# Patient Record
Sex: Female | Born: 1975 | Race: White | Hispanic: No | Marital: Married | Smoking: Never smoker
Health system: Southern US, Community
[De-identification: ages and names within clinical notes are randomized; demographics above are authoritative.]

## PROBLEM LIST (undated history)

## (undated) DIAGNOSIS — C801 Malignant (primary) neoplasm, unspecified: Secondary | ICD-10-CM

## (undated) DIAGNOSIS — E119 Type 2 diabetes mellitus without complications: Secondary | ICD-10-CM

## (undated) DIAGNOSIS — E063 Autoimmune thyroiditis: Secondary | ICD-10-CM

## (undated) HISTORY — PX: CHOLECYSTECTOMY: SHX55

## (undated) HISTORY — DX: Type 2 diabetes mellitus without complications: E11.9

## (undated) HISTORY — PX: ABDOMINAL HYSTERECTOMY: SHX81

---

## 2017-09-12 ENCOUNTER — Emergency Department (HOSPITAL_COMMUNITY)
Admission: EM | Admit: 2017-09-12 | Discharge: 2017-09-12 | Disposition: A | Discharge status: Home / Self Care | Payer: Self-pay | Attending: Emergency Medicine | Admitting: Emergency Medicine

## 2017-09-12 ENCOUNTER — Emergency Department (HOSPITAL_COMMUNITY): Payer: Self-pay

## 2017-09-12 ENCOUNTER — Encounter (HOSPITAL_COMMUNITY): Payer: Self-pay | Admitting: Emergency Medicine

## 2017-09-12 ENCOUNTER — Other Ambulatory Visit: Payer: Self-pay

## 2017-09-12 DIAGNOSIS — Z23 Encounter for immunization: Secondary | ICD-10-CM | POA: Insufficient documentation

## 2017-09-12 DIAGNOSIS — Y92511 Restaurant or cafe as the place of occurrence of the external cause: Secondary | ICD-10-CM | POA: Insufficient documentation

## 2017-09-12 DIAGNOSIS — X501XXA Overexertion from prolonged static or awkward postures, initial encounter: Secondary | ICD-10-CM | POA: Insufficient documentation

## 2017-09-12 DIAGNOSIS — W19XXXA Unspecified fall, initial encounter: Secondary | ICD-10-CM

## 2017-09-12 DIAGNOSIS — Y939 Activity, unspecified: Secondary | ICD-10-CM | POA: Insufficient documentation

## 2017-09-12 DIAGNOSIS — Y999 Unspecified external cause status: Secondary | ICD-10-CM | POA: Insufficient documentation

## 2017-09-12 DIAGNOSIS — M79672 Pain in left foot: Secondary | ICD-10-CM | POA: Insufficient documentation

## 2017-09-12 MED ORDER — TETANUS-DIPHTH-ACELL PERTUSSIS 5-2.5-18.5 LF-MCG/0.5 IM SUSP
0.5000 mL | Freq: Once | INTRAMUSCULAR | Status: AC
  Administered 2017-09-12: 0.5 mL via INTRAMUSCULAR
  Filled 2017-09-12: qty 0.5

## 2017-09-12 MED ORDER — FENTANYL CITRATE (PF) 100 MCG/2ML IJ SOLN
25.0000 ug | Freq: Once | INTRAMUSCULAR | Status: AC
Start: 2017-09-12 — End: 2017-09-12
  Administered 2017-09-12: 25 ug via INTRAMUSCULAR
  Filled 2017-09-12: qty 2

## 2017-09-12 MED ORDER — BACITRACIN ZINC 500 UNIT/GM EX OINT
TOPICAL_OINTMENT | Freq: Two times a day (BID) | CUTANEOUS | Status: DC
  Administered 2017-09-12: 1 via TOPICAL
  Filled 2017-09-12: qty 0.9

## 2017-09-12 MED ORDER — ACETAMINOPHEN 325 MG PO TABS
650.0000 mg | ORAL_TABLET | Freq: Once | ORAL | Status: AC
  Administered 2017-09-12: 650 mg via ORAL
  Filled 2017-09-12: qty 2

## 2017-09-12 NOTE — Discharge Instructions (Addendum)
You may alternate taking Tylenol and Ibuprofen as needed for pain control. You may take 400-600 mg of ibuprofen every 6 hours and [phone] mg of Tylenol every 6 hours. Do not exceed 4000 mg of Tylenol daily as this can lead to liver damage. Also, make sure to take Ibuprofen with meals as it can cause an upset stomach. Do not take other NSAIDs while taking Ibuprofen such as (Aleve, Naprosyn, Aspirin, Celebrex, etc) and do not take more than the prescribed dose as this can lead to ulcers and bleeding in your GI tract. You may use warm and cold compresses to help with your symptoms.   Please follow up with your primary doctor within the next 7-10 days for re-evaluation and further treatment of your symptoms.  You were given a referral to the orthopedic doctor.  If you continue to have pain you may call the office and make an appointment for follow-up.  Please return to the ER sooner if you have any new or worsening symptoms.

## 2017-09-12 NOTE — ED Triage Notes (Signed)
Pt reports she was coming out of restaurant and fell in mud. Pt c/o right foot pain, swelling noted to lateral side.  Pt has abrasion to left knee.

## 2017-09-12 NOTE — ED Notes (Signed)
Bed: WTR6 Expected date:  Expected time:  Means of arrival:  Comments: 

## 2017-09-12 NOTE — ED Provider Notes (Signed)
[facility] DEPT Provider Note   CSN: [id number] Arrival date & time: 09/12/17  1218     History   Chief Complaint Chief Complaint  Patient presents with  . Fall  . Foot Pain    HPI Joanne Rowland is a 42 y.o. female.  HPI   42 year old female who presents the emergency department today to be evaluated for right foot injury that began today after a fall.  States she was walking out of a restaurant and slipped and fell after walking through some mild.  Twisted her right foot and ankle and is now having right lateral foot and ankle pain.  Pain sudden in onset, severe in nature.  Worse with movement and walking.  Has not tried any interventions for symptoms.  Also has abrasion to left knee and left foot but no knee pain or foot pain on the left side.  No head trauma or LOC.  No neck or back pain no chest pain shortness of breath, abdominal pain or any other injuries from the fall.  Patient states her tetanus shot is not up-to-date.  No associated numbness or weakness to the right foot.  History reviewed. No pertinent past medical history.  There are no active problems to display for this patient.   History reviewed. No pertinent surgical history.   OB History   None      Home Medications    Prior to Admission medications   Not on File    Family History No family history on file.  Social History Social History   Tobacco Use  . Smoking status: Never Smoker  . Smokeless tobacco: Never Used  Substance Use Topics  . Alcohol use: Yes    Comment: occasional   . Drug use: Not on file     Allergies   Patient has no allergy information on record.   Review of Systems Review of Systems  Musculoskeletal:       Right foot and ankle pain  Skin: Positive for wound.  Neurological: Negative for weakness and numbness.     Physical Exam Updated Vital Signs BP (!) 130/95 (BP Location: Left Arm)   Pulse 94   Temp 98.1 F (36.7 C) (Oral)    Resp 16   Ht 5\' 7"  (1.702 m)   Wt 104.3 kg (230 lb)   SpO2 96%   BMI 36.02 kg/m   Physical Exam  Constitutional: She appears well-developed and well-nourished. No distress.  HENT:  Head: Normocephalic and atraumatic.  Eyes: Conjunctivae are normal.  Neck: Neck supple.  Cardiovascular: Normal rate.  Pulmonary/Chest: Effort normal.  Musculoskeletal: Normal range of motion.  Tenderness to the lateral aspect of the right hindfoot and midfoot with overlying swelling.  No erythema.  No significant tenderness to the forefoot or medial aspect of the foot.  No tenderness to the medial malleolus.  Mild tenderness to the inferior portion of the lateral malleolus.  Achilles tendon intact.  Dorsiflexion/plantar flexion intact bilaterally able to flex/extend knees bilaterally.  No tenderness throughout the bilateral knees.  Abrasion noted to the left knee with no active bleeding or foreign bodies.  Mild abrasion to the dorsal aspect of the left foot.  Distal pulses and sensation intact bilaterally.  Brisk cap refill bilaterally.  Able to wiggle toes bilaterally.  Neurological: She is alert.  Skin: Skin is warm and dry.  Psychiatric: She has a normal mood and affect.  Nursing note and vitals reviewed.   ED Treatments / Results  Labs (  all labs ordered are listed, but only abnormal results are displayed) Labs Reviewed - No data to display  EKG None  Radiology Dg Ankle Complete Right  Result Date: 09/12/2017 CLINICAL DATA:  Pain following fall EXAM: RIGHT ANKLE - COMPLETE 3+ VIEW COMPARISON:  None. FINDINGS: Frontal, oblique, and lateral views were obtained. There is no evident fracture or joint effusion. The joint spaces appear normal. No erosive change. Ankle mortise appears intact. There is a minimal inferior calcaneal spur. IMPRESSION: Minimal inferior calcaneal spur. No evident fracture or arthropathic change. Ankle mortise appears intact. Electronically Signed   By: Lowella Grip III M.D.    On: 09/12/2017 13:33   Dg Foot Complete Right  Result Date: 09/12/2017 CLINICAL DATA:  Pain following fall EXAM: RIGHT FOOT COMPLETE - 3+ VIEW COMPARISON:  None. FINDINGS: Frontal, oblique, and lateral views were obtained. There is no fracture or dislocation. Joint spaces appear normal. No erosive change. IMPRESSION: No fracture or dislocation.  No evident arthropathy. Electronically Signed   By: Lowella Grip III M.D.   On: 09/12/2017 13:32    Procedures Procedures (including critical care time)  Medications Ordered in ED Medications  bacitracin ointment (1 application Topical Given 09/12/17 1409)  Tdap (BOOSTRIX) injection 0.5 mL (0.5 mLs Intramuscular Given 09/12/17 1312)  fentaNYL (SUBLIMAZE) injection 25 mcg (25 mcg Intramuscular Given 09/12/17 1314)  acetaminophen (TYLENOL) tablet 650 mg (650 mg Oral Given 09/12/17 1311)     Initial Impression / Assessment and Plan / ED Course  I have reviewed the triage vital signs and the nursing notes.  Pertinent labs & imaging results that were available during my care of the patient were reviewed by me and considered in my medical decision making (see chart for details).   Final Clinical Impressions(s) / ED Diagnoses   Final diagnoses:  Fall, initial encounter  Foot pain, left   Patient presenting with right foot and ankle pain after fall prior to arrival.  Mildly hypertensive and borderline tachycardic on arrival.  Otherwise vital signs normal.  All vital signs normalized by the time of discharge.  She was given fentanyl and Tylenol in the ED with improvement of her pain.  Tdap was updated and wound care completed on abrasions.  X-ray of the right foot and ankle reviewed and were negative for acute fracture or abnormality.  Patient given postop shoe and crutches to use.  Advised Tylenol, Motrin and rice protocol for symptoms.  She was also given an orthopedic referral if symptoms continue.  Advised her to follow-up with primary doctor in 1 week  for reevaluation and to return if worse.  All questions were answered and patient understands plan reasons to return immediately to the ED.Marland Kitchen   ED Discharge Orders    None       Bishop Dublin 09/12/17 1448    Quintella Reichert, MD 09/13/17 [phone]

## 2017-09-22 ENCOUNTER — Ambulatory Visit (INDEPENDENT_AMBULATORY_CARE_PROVIDER_SITE_OTHER): Payer: Self-pay | Admitting: Orthopaedic Surgery

## 2017-09-23 ENCOUNTER — Encounter (INDEPENDENT_AMBULATORY_CARE_PROVIDER_SITE_OTHER): Payer: Self-pay | Admitting: Orthopaedic Surgery

## 2017-09-23 ENCOUNTER — Ambulatory Visit (INDEPENDENT_AMBULATORY_CARE_PROVIDER_SITE_OTHER): Payer: Self-pay | Admitting: Orthopaedic Surgery

## 2017-09-23 DIAGNOSIS — M25571 Pain in right ankle and joints of right foot: Secondary | ICD-10-CM

## 2017-09-23 NOTE — Progress Notes (Signed)
   Office Visit Note   Patient: Joanne Rowland           Date of Birth: Dec 16, 1975           MRN: [id number] Visit Date: 09/23/2017              Requested by: No referring provider defined for this encounter. PCP: Patient, No Pcp Per   Assessment & Plan: Visit Diagnoses:  1. Pain in right ankle and joints of right foot     Plan: Impression is 42 year old female with right grade 3 ankle sprain.  Recommend ASO brace for the next couple weeks and then wean as tolerated.  Recommend continue symptomatic treatment.  Questions encouraged and answered.  Follow-up as needed.  Follow-Up Instructions: Return if symptoms worsen or fail to improve.   Orders:  No orders of the defined types were placed in this encounter.  No orders of the defined types were placed in this encounter.     Procedures: No procedures performed   Clinical Data: No additional findings.   Subjective: Chief Complaint  Patient presents with  . Right Foot - Pain    Patient is a very pleasant 42 year old female comes in with acute right ankle injury.  She sustained a sprain about 10 days ago when she had a mechanical fall.  She presented to the ED and x-rays were negative.  She continues to have pain and wants further evaluation and treatment.  Denies any numbness and tingling.   Review of Systems  Constitutional: Negative.   HENT: Negative.   Eyes: Negative.   Respiratory: Negative.   Cardiovascular: Negative.   Endocrine: Negative.   Musculoskeletal: Negative.   Neurological: Negative.   Hematological: Negative.   Psychiatric/Behavioral: Negative.   All other systems reviewed and are negative.    Objective: Vital Signs: There were no vitals taken for this visit.  Physical Exam  Constitutional: She is oriented to person, place, and time. She appears well-developed and well-nourished.  HENT:  Head: Normocephalic and atraumatic.  Eyes: EOM are normal.  Neck: Neck supple.  Pulmonary/Chest:  Effort normal.  Abdominal: Soft.  Neurological: She is alert and oriented to person, place, and time.  Skin: Skin is warm. Capillary refill takes less than 2 seconds.  Psychiatric: She has a normal mood and affect. Her behavior is normal. Judgment and thought content normal.  Nursing note and vitals reviewed.   Ortho Exam Right ankle exam shows moderate ecchymosis and mild swelling.  Ankle joint is stable.  Subtalar joint is stable.  Neurovascular intact. Specialty Comments:  No specialty comments available.  Imaging: No results found.   PMFS History: There are no active problems to display for this patient.  History reviewed. No pertinent past medical history.  History reviewed. No pertinent family history.  History reviewed. No pertinent surgical history. Social History   Occupational History  . Not on file  Tobacco Use  . Smoking status: Never Smoker  . Smokeless tobacco: Never Used  Substance and Sexual Activity  . Alcohol use: Yes    Comment: occasional   . Drug use: Not on file  . Sexual activity: Not on file

## 2019-04-11 ENCOUNTER — Emergency Department
Admission: EM | Admit: 2019-04-11 | Discharge: 2019-04-11 | Disposition: A | Discharge status: Home / Self Care | Payer: BC Managed Care – PPO | Attending: Emergency Medicine | Admitting: Emergency Medicine

## 2019-04-11 ENCOUNTER — Other Ambulatory Visit: Payer: Self-pay

## 2019-04-11 ENCOUNTER — Emergency Department: Payer: BC Managed Care – PPO

## 2019-04-11 ENCOUNTER — Encounter: Payer: Self-pay | Admitting: Emergency Medicine

## 2019-04-11 DIAGNOSIS — Z888 Allergy status to other drugs, medicaments and biological substances status: Secondary | ICD-10-CM | POA: Diagnosis not present

## 2019-04-11 DIAGNOSIS — M5442 Lumbago with sciatica, left side: Secondary | ICD-10-CM | POA: Insufficient documentation

## 2019-04-11 DIAGNOSIS — M5441 Lumbago with sciatica, right side: Secondary | ICD-10-CM | POA: Diagnosis not present

## 2019-04-11 DIAGNOSIS — M5440 Lumbago with sciatica, unspecified side: Secondary | ICD-10-CM

## 2019-04-11 DIAGNOSIS — M545 Low back pain: Secondary | ICD-10-CM | POA: Diagnosis present

## 2019-04-11 MED ORDER — KETOROLAC TROMETHAMINE 30 MG/ML IJ SOLN
30.0000 mg | Freq: Once | INTRAMUSCULAR | Status: AC
  Administered 2019-04-11: 30 mg via INTRAMUSCULAR
  Filled 2019-04-11: qty 1

## 2019-04-11 MED ORDER — METHOCARBAMOL 500 MG PO TABS: ORAL_TABLET | ORAL | 0 refills | Status: DC

## 2019-04-11 MED ORDER — OXYCODONE-ACETAMINOPHEN 5-325 MG PO TABS
1.0000 | ORAL_TABLET | Freq: Once | ORAL | Status: AC
  Administered 2019-04-11: 1 via ORAL
  Filled 2019-04-11: qty 1

## 2019-04-11 MED ORDER — HYDROCODONE-ACETAMINOPHEN 5-325 MG PO TABS: 1.0000 | ORAL_TABLET | Freq: Four times a day (QID) | ORAL | 0 refills | Status: DC | PRN

## 2019-04-11 MED ORDER — PREDNISONE 10 MG PO TABS: ORAL_TABLET | ORAL | 0 refills | Status: DC

## 2019-04-11 NOTE — ED Provider Notes (Signed)
[facility] Emergency Department Provider Note  ____________________________________________   First MD Initiated Contact with Patient 04/11/19 1009     (approximate)  I have reviewed the triage vital signs and the nursing notes.   HISTORY  Chief Complaint Back Pain   HPI Joanne Rowland is a 44 y.o. female presents to the ED with complaint of low back pain that started approximately 4 weeks ago.  Patient is unsure of any injury.  She states that she walks 5K's and thought that the pain was coming because of her feet and made an appointment with podiatry.  She states that today the pain is increased and she cannot wait for her appointment.  She has been taking over-the-counter medication without any relief.  She rates her pain as 9/10.  She denies any urinary symptoms or incontinence of bowel or bladder.  There is been no previous back injury.       History reviewed. No pertinent past medical history.  There are no problems to display for this patient.   History reviewed. No pertinent surgical history.  Prior to Admission medications   Medication Sig Start Date End Date Taking? Authorizing Provider  HYDROcodone-acetaminophen (NORCO/VICODIN) 5-325 MG tablet Take 1 tablet by mouth every 6 (six) hours as needed for moderate pain. 04/11/19   Johnn Hai, PA-C  methocarbamol (ROBAXIN) 500 MG tablet 1 or 2 every 6 hours as needed for muscle spasms or take it bedtime only if needed 04/11/19   Johnn Hai, PA-C  predniSONE (DELTASONE) 10 MG tablet Take 6 tablets  today, on day 2 take 5 tablets, day 3 take 4 tablets, day 4 take 3 tablets, day 5 take  2 tablets and 1 tablet the last day 04/11/19   Johnn Hai, PA-C    Allergies Sumatriptan  No family history on file.  Social History Social History   Tobacco Use  . Smoking status: Never Smoker  . Smokeless tobacco: Never Used  Substance Use Topics  . Alcohol use: Yes    Comment: occasional   .  Drug use: Not on file    Review of Systems Constitutional: No fever/chills Cardiovascular: Denies chest pain. Respiratory: Denies shortness of breath. Gastrointestinal: No abdominal pain.  No nausea, no vomiting.   Genitourinary: Negative for dysuria. Musculoskeletal: Positive for low back pain and bilateral feet pain. Skin: Negative for rash. Neurological: Negative for headaches, focal weakness or numbness. ____________________________________________   PHYSICAL EXAM:  VITAL SIGNS: ED Triage Vitals  Enc Vitals Group     BP 04/11/19 0918 (!) 150/96     Pulse Rate 04/11/19 0918 78     Resp 04/11/19 0918 18     Temp 04/11/19 0918 98.2 F (36.8 C)     Temp Source 04/11/19 0918 Oral     SpO2 04/11/19 0918 97 %     Weight 04/11/19 0918 210 lb (95.3 kg)     Height 04/11/19 0918 5\' 7"  (1.702 m)     Head Circumference --      Peak Flow --      Pain Score 04/11/19 0922 9     Pain Loc --      Pain Edu? --      Excl. in [place]? --     Constitutional: Alert and oriented. Well appearing and in no acute distress. Eyes: Conjunctivae are normal.  Head: Atraumatic. Neck: No stridor.   Cardiovascular: Normal rate, regular rhythm. Grossly normal heart sounds.  Good peripheral circulation. Respiratory: Normal  respiratory effort.  No retractions. Lungs CTAB. Gastrointestinal: Soft and nontender. No distention. Musculoskeletal: On examination of the lumbar spine there is moderate tenderness on palpation bilateral sacral area and SI joints.  Right is slightly more tender than the left.  Range of motion is restricted.  Good muscle strength bilaterally.   Patient is able to ambulate without any assistance. Neurologic:  Normal speech and language.  Reflexes are 2+ bilaterally.  No gross focal neurologic deficits are appreciated.  Skin:  Skin is warm, dry and intact. Psychiatric: Mood and affect are normal. Speech and behavior are normal.  ____________________________________________   LABS (all  labs ordered are listed, but only abnormal results are displayed)  Labs Reviewed - No data to display RADIOLOGY   Official radiology report(s): DG Lumbar Spine 2-3 Views  Result Date: 04/11/2019 CLINICAL DATA:  Worsening low back pain over the past 4 weeks. No known injury. EXAM: LUMBAR SPINE - 2-3 VIEW COMPARISON:  None. FINDINGS: There is no evidence of lumbar spine fracture. Alignment is normal. Intervertebral disc spaces are maintained. IMPRESSION: Normal exam. Electronically Signed   By: Inge Rise M.D.   On: 04/11/2019 12:17    ____________________________________________   PROCEDURES  Procedure(s) performed (including Critical Care):  Procedures   ____________________________________________   INITIAL IMPRESSION / ASSESSMENT AND PLAN / ED COURSE  As part of my medical decision making, I reviewed the following data within the electronic MEDICAL RECORD NUMBER Notes from prior ED visits and [place] Controlled Substance Database  44 year old female presents to the ED with complaint of low back pain which travels down her legs bilaterally causing her feet to hurt.  Patient initially thought that it was her feet causing her back to hurt as she walks in multiple 5K's.  Patient was given Toradol 30 mg IM and Percocet while in the ED.  X-rays of her lumbar spine were negative.  Patient was made aware that most likely her radicular pain is coming from her back rather than from her feet.  She was given a prescription for Norco, Robaxin, and prednisone taper.  Patient is to follow-up with her PCP if any continued problems or return to the emergency department if any severe worsening of her symptoms especially incontinence of bowel or bladder.  ____________________________________________   FINAL CLINICAL IMPRESSION(S) / ED DIAGNOSES  Final diagnoses:  Bilateral low back pain with sciatica, sciatica laterality unspecified, unspecified chronicity     ED Discharge Orders         Ordered      HYDROcodone-acetaminophen (NORCO/VICODIN) 5-325 MG tablet  Every 6 hours PRN     04/11/19 1242    methocarbamol (ROBAXIN) 500 MG tablet     04/11/19 1242    predniSONE (DELTASONE) 10 MG tablet     04/11/19 1242           Note:  This document was prepared using Dragon voice recognition software and may include unintentional dictation errors.    Johnn Hai, PA-C 04/11/19 1644    Arta Silence, MD 04/17/19 [phone]

## 2019-04-11 NOTE — ED Triage Notes (Signed)
Pt c/o lower back pain for the past 4 weeks that has gotten worse. Pt states walks 5k's and thought it may be related to that so made an appt with podiatry on Wednesday but cannot wait until then.

## 2019-04-11 NOTE — Discharge Instructions (Addendum)
Follow-up with your primary care provider or [facility] acute care if any continued problems.  Return to the emergency department if any severe worsening of your symptoms especially if there is any incontinence of bowel or bladder.  Begin taking Norco 1 every 6 hours as needed for pain and methocarbamol can be taken 1 or 2 every 6 hours as needed for muscle spasms.  This is the medication that could be taken only at bedtime if needed.  This medication along with the pain medication could cause drowsiness and increase your risk for falling.  Prednisone is to be started today starting with 6 tablets and tapering down.  You may also use ice or heat to your back as needed for discomfort.

## 2019-04-11 NOTE — ED Notes (Signed)
See triage note  Presents with lower back pain  Stats pain started about 4 weeks ago  Unsure of injury  Ambulates slowly d/t pain

## 2019-04-13 ENCOUNTER — Other Ambulatory Visit: Payer: Self-pay

## 2019-04-13 ENCOUNTER — Ambulatory Visit (INDEPENDENT_AMBULATORY_CARE_PROVIDER_SITE_OTHER): Payer: BC Managed Care – PPO | Admitting: Podiatry

## 2019-04-13 ENCOUNTER — Other Ambulatory Visit: Payer: Self-pay | Admitting: Podiatry

## 2019-04-13 ENCOUNTER — Encounter: Payer: Self-pay | Admitting: Podiatry

## 2019-04-13 ENCOUNTER — Ambulatory Visit: Payer: Self-pay | Admitting: Podiatry

## 2019-04-13 ENCOUNTER — Ambulatory Visit (INDEPENDENT_AMBULATORY_CARE_PROVIDER_SITE_OTHER): Payer: BC Managed Care – PPO

## 2019-04-13 DIAGNOSIS — M2041 Other hammer toe(s) (acquired), right foot: Secondary | ICD-10-CM

## 2019-04-13 DIAGNOSIS — Q828 Other specified congenital malformations of skin: Secondary | ICD-10-CM | POA: Diagnosis not present

## 2019-04-13 DIAGNOSIS — M7750 Other enthesopathy of unspecified foot: Secondary | ICD-10-CM

## 2019-04-13 NOTE — Progress Notes (Signed)
  Subjective:  Patient ID: Joanne Rowland, female    DOB: January 12, 1976,  MRN: [id number] HPI Chief Complaint  Patient presents with  . Callouses    Patient presents today for painful corn to right 4th toe x 2 months.  She states "its a hot constant pain that radiates across the toe and into the top of my foot"  She has used cornpads which has not helped    44 y.o. female presents with the above complaint.   ROS: Denies fever chills nausea vomiting muscle aches pains calf pain back pain chest pain shortness of breath.  No past medical history on file. No past surgical history on file.  Current Outpatient Medications:  .  HYDROcodone-acetaminophen (NORCO/VICODIN) 5-325 MG tablet, Take 1 tablet by mouth every 6 (six) hours as needed for moderate pain., Disp: 15 tablet, Rfl: 0 .  Ibuprofen 200 MG CAPS, , Disp: , Rfl:  .  methocarbamol (ROBAXIN) 500 MG tablet, 1 or 2 every 6 hours as needed for muscle spasms or take it bedtime only if needed, Disp: 20 tablet, Rfl: 0 .  predniSONE (DELTASONE) 10 MG tablet, Take 6 tablets  today, on day 2 take 5 tablets, day 3 take 4 tablets, day 4 take 3 tablets, day 5 take  2 tablets and 1 tablet the last day, Disp: 21 tablet, Rfl: 0  Allergies  Allergen Reactions  . Sumatriptan Anaphylaxis   Review of Systems Objective:  There were no vitals filed for this visit.  General: Well developed, nourished, in no acute distress, alert and oriented x3   Dermatological: Skin is warm, dry and supple bilateral. Nails x 10 are well maintained; remaining integument appears unremarkable at this time. There are no open sores, no preulcerative lesions, no rash or signs of infection present.  Vascular: Dorsalis Pedis artery and Posterior Tibial artery pedal pulses are 2/4 bilateral with immedate capillary fill time. Pedal hair growth present. No varicosities and no lower extremity edema present bilateral.   Neruologic: Grossly intact via light touch bilateral. Vibratory  intact via tuning fork bilateral. Protective threshold with Semmes Wienstein monofilament intact to all pedal sites bilateral. Patellar and Achilles deep tendon reflexes 2+ bilateral. No Babinski or clonus noted bilateral.   Musculoskeletal: No gross boney pedal deformities bilateral. No pain, crepitus, or limitation noted with foot and ankle range of motion bilateral. Muscular strength 5/5 in all groups tested bilateral.  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  This is most likely associated with the fifth toe rubbing the fourth toe.   Gait: Unassisted, Nonantalgic.    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  Plan: Dexamethasone was injected today the lateral aspect of the PIPJ fourth digit right foot.  I debrided the overlying reactive hyperkeratotic lesion and will follow-up with her on an as-needed basis.  We did briefly discuss the possible need for surgery.  Also provided her with a silicone sleeves.     Maryam Feely T. [place], [place]

## 2019-08-23 ENCOUNTER — Ambulatory Visit (INDEPENDENT_AMBULATORY_CARE_PROVIDER_SITE_OTHER): Payer: BC Managed Care – PPO

## 2019-08-23 ENCOUNTER — Ambulatory Visit (INDEPENDENT_AMBULATORY_CARE_PROVIDER_SITE_OTHER): Payer: BC Managed Care – PPO | Admitting: Podiatry

## 2019-08-23 ENCOUNTER — Other Ambulatory Visit: Payer: Self-pay | Admitting: Podiatry

## 2019-08-23 ENCOUNTER — Encounter: Payer: Self-pay | Admitting: Podiatry

## 2019-08-23 ENCOUNTER — Other Ambulatory Visit: Payer: Self-pay

## 2019-08-23 DIAGNOSIS — S99922A Unspecified injury of left foot, initial encounter: Secondary | ICD-10-CM | POA: Diagnosis not present

## 2019-08-23 DIAGNOSIS — S93602A Unspecified sprain of left foot, initial encounter: Secondary | ICD-10-CM

## 2019-08-23 MED ORDER — MELOXICAM 15 MG PO TABS: 15.0000 mg | ORAL_TABLET | Freq: Every day | ORAL | 1 refills | Status: DC

## 2019-08-25 NOTE — Progress Notes (Signed)
   HPI: 44 y.o. female presenting today with a chief complaint of throbbing, pulling pain to the left ankle that began 5 days ago secondary to an injury on 08/18/2019. She states she initially twisted the ankle and foot but ran a 5k the next day. She now reports severe pain and swelling. Walking and bearing weight increases the pain. She has been icing and elevating the foot, taking Ibuprofen and Tylenol for treatment. Patient is here for further evaluation and treatment.  No past medical history on file.   Physical Exam: General: The patient is alert and oriented x3 in no acute distress.  Dermatology: Skin is warm, dry and supple bilateral lower extremities. Negative for open lesions or macerations.  Vascular: Palpable pedal pulses bilaterally. No erythema noted. Capillary refill within normal limits.  Neurological: Epicritic and protective threshold grossly intact bilaterally.   Musculoskeletal Exam: Pain with palpation noted to the left lateral foot with mild edema noted. Range of motion within normal limits to all pedal and ankle joints bilateral. Muscle strength 5/5 in all groups bilateral.   Radiographic Exam:  Normal osseous mineralization. Joint spaces preserved. No fracture/dislocation/boney destruction.    Assessment: 1. Left lateral foot sprain    Plan of Care:  1. Patient evaluated. X-Rays reviewed.  2. CAM boot dispensed. Weightbearing for two weeks.  3. Prescription for Meloxicam provided to patient. 4. Return to clinic as needed.       Edrick Kins, DPM [facility]  Dr. Edrick Kins, DPM    [address], [place] [address]                Office [phone]  Fax [phone]

## 2019-09-01 ENCOUNTER — Other Ambulatory Visit: Payer: Self-pay

## 2019-09-01 ENCOUNTER — Emergency Department (HOSPITAL_COMMUNITY): Payer: BC Managed Care – PPO

## 2019-09-01 ENCOUNTER — Encounter (HOSPITAL_COMMUNITY): Payer: Self-pay

## 2019-09-01 ENCOUNTER — Emergency Department (HOSPITAL_COMMUNITY)
Admission: EM | Admit: 2019-09-01 | Discharge: 2019-09-01 | Disposition: A | Discharge status: Home / Self Care | Payer: BC Managed Care – PPO | Attending: Emergency Medicine | Admitting: Emergency Medicine

## 2019-09-01 DIAGNOSIS — K529 Noninfective gastroenteritis and colitis, unspecified: Secondary | ICD-10-CM

## 2019-09-01 DIAGNOSIS — Z8542 Personal history of malignant neoplasm of other parts of uterus: Secondary | ICD-10-CM | POA: Insufficient documentation

## 2019-09-01 DIAGNOSIS — R1013 Epigastric pain: Secondary | ICD-10-CM | POA: Insufficient documentation

## 2019-09-01 DIAGNOSIS — R197 Diarrhea, unspecified: Secondary | ICD-10-CM | POA: Insufficient documentation

## 2019-09-01 DIAGNOSIS — Z79899 Other long term (current) drug therapy: Secondary | ICD-10-CM | POA: Diagnosis not present

## 2019-09-01 HISTORY — DX: Malignant (primary) neoplasm, unspecified: C80.1

## 2019-09-01 LAB — CBC
HCT: 46 % (ref 36.0–46.0)
Hemoglobin: 15.3 g/dL — ABNORMAL HIGH (ref 12.0–15.0)
MCH: 30.3 pg (ref 26.0–34.0)
MCHC: 33.3 g/dL (ref 30.0–36.0)
MCV: 91.1 fL (ref 80.0–100.0)
Platelets: 272 10*3/uL (ref 150–400)
RBC: 5.05 MIL/uL (ref 3.87–5.11)
RDW: 13.7 % (ref 11.5–15.5)
WBC: 8.6 10*3/uL (ref 4.0–10.5)
nRBC: 0 % (ref 0.0–0.2)

## 2019-09-01 LAB — COMPREHENSIVE METABOLIC PANEL
ALT: 49 U/L — ABNORMAL HIGH (ref 0–44)
AST: 59 U/L — ABNORMAL HIGH (ref 15–41)
Albumin: 3.9 g/dL (ref 3.5–5.0)
Alkaline Phosphatase: 83 U/L (ref 38–126)
Anion gap: 13 (ref 5–15)
BUN: 13 mg/dL (ref 6–20)
CO2: 23 mmol/L (ref 22–32)
Calcium: 8.7 mg/dL — ABNORMAL LOW (ref 8.9–10.3)
Chloride: 101 mmol/L (ref 98–111)
Creatinine, Ser: 0.85 mg/dL (ref 0.44–1.00)
GFR calc Af Amer: >60 mL/min (ref >60)
GFR calc non Af Amer: >60 mL/min (ref >60)
Glucose, Bld: 127 mg/dL — ABNORMAL HIGH (ref 70–99)
Potassium: 4.2 mmol/L (ref 3.5–5.1)
Sodium: 137 mmol/L (ref 135–145)
Total Bilirubin: 1 mg/dL (ref 0.3–1.2)
Total Protein: 7.6 g/dL (ref 6.5–8.1)

## 2019-09-01 LAB — URINALYSIS, ROUTINE W REFLEX MICROSCOPIC
Bilirubin Urine: NEGATIVE
Glucose, UA: NEGATIVE mg/dL
Hgb urine dipstick: NEGATIVE
Ketones, ur: NEGATIVE mg/dL
Leukocytes,Ua: NEGATIVE
Nitrite: NEGATIVE
Protein, ur: NEGATIVE mg/dL
Specific Gravity, Urine: 1.014 (ref 1.005–1.030)
pH: 6 (ref 5.0–8.0)

## 2019-09-01 LAB — LIPASE, BLOOD: Lipase: 31 U/L (ref 11–51)

## 2019-09-01 MED ORDER — ONDANSETRON HCL 4 MG/2ML IJ SOLN
4.0000 mg | Freq: Once | INTRAMUSCULAR | Status: AC
  Administered 2019-09-01: 4 mg via INTRAVENOUS
  Filled 2019-09-01: qty 2

## 2019-09-01 MED ORDER — HYDROCODONE-ACETAMINOPHEN 5-325 MG PO TABS: 1.0000 | ORAL_TABLET | Freq: Four times a day (QID) | ORAL | 0 refills | Status: DC | PRN

## 2019-09-01 MED ORDER — IOHEXOL 300 MG/ML  SOLN
100.0000 mL | Freq: Once | INTRAMUSCULAR | Status: AC | PRN
  Administered 2019-09-01: 100 mL via INTRAVENOUS

## 2019-09-01 MED ORDER — SODIUM CHLORIDE 0.9 % IV BOLUS
1000.0000 mL | Freq: Once | INTRAVENOUS | Status: AC
  Administered 2019-09-01: 1000 mL via INTRAVENOUS

## 2019-09-01 MED ORDER — SODIUM CHLORIDE (PF) 0.9 % IJ SOLN
INTRAMUSCULAR | Status: AC
  Filled 2019-09-01: qty 50

## 2019-09-01 MED ORDER — METRONIDAZOLE 500 MG PO TABS
500.0000 mg | ORAL_TABLET | Freq: Three times a day (TID) | ORAL | 0 refills | Status: DC
Start: 2019-09-01 — End: 2021-09-06

## 2019-09-01 MED ORDER — KETOROLAC TROMETHAMINE 30 MG/ML IJ SOLN
30.0000 mg | Freq: Once | INTRAMUSCULAR | Status: AC
  Administered 2019-09-01: 30 mg via INTRAVENOUS
  Filled 2019-09-01: qty 1

## 2019-09-01 MED ORDER — CIPROFLOXACIN HCL 500 MG PO TABS
500.0000 mg | ORAL_TABLET | Freq: Two times a day (BID) | ORAL | 0 refills | Status: DC
Start: 2019-09-01 — End: 2021-09-06

## 2019-09-01 MED ORDER — MORPHINE SULFATE (PF) 4 MG/ML IV SOLN
4.0000 mg | Freq: Once | INTRAVENOUS | Status: AC
  Administered 2019-09-01: 4 mg via INTRAVENOUS
  Filled 2019-09-01: qty 1

## 2019-09-01 NOTE — Discharge Instructions (Addendum)
Begin taking Cipro and Flagyl as prescribed.  Begin taking hydrocodone as prescribed as needed for pain.  Return to the emergency department if you develop high fever, worsening abdominal pain, bloody stools, or other new and concerning symptoms.

## 2019-09-01 NOTE — ED Triage Notes (Signed)
Pt sts umbilicus abdominal pain for 2 days and diarrhea occurring q40min.

## 2019-09-01 NOTE — ED Provider Notes (Signed)
[facility] DEPT Provider Note   CSN: [id number] Arrival date & time: 09/01/19  0335     History Chief Complaint  Patient presents with  . Abdominal Pain    Joanne Rowland is a 44 y.o. female.  Patient is a 44 year old female with past medical history of prior cholecystectomy and endometrial cancer treated with hysterectomy and required additional procedures, but no chemo or radiation.  She presents today for evaluation of abdominal pain and diarrhea.  Patient first noticed fever 3 days ago followed by multiple episodes of diarrhea.  She describes intermittent, severe abdominal cramps that have occurred throughout the day.  She denies any bloody stool.  She denies any vomiting.  She denies any ill contacts or having consumed any suspicious foods.  The history is provided by the patient.  Abdominal Pain Pain location:  Epigastric Pain quality: cramping   Pain radiates to:  Suprapubic region Pain severity:  Severe Onset quality:  Sudden Duration:  3 days Timing:  Intermittent Progression:  Worsening Chronicity:  New Relieved by:  Nothing Worsened by:  Movement and palpation Ineffective treatments:  None tried Associated symptoms: nausea   Associated symptoms: no fatigue, no fever, no hematemesis, no hematochezia and no vomiting        History reviewed. No pertinent past medical history.  There are no problems to display for this patient.   History reviewed. No pertinent surgical history.   OB History   No obstetric history on file.     No family history on file.  Social History   Tobacco Use  . Smoking status: Never Smoker  . Smokeless tobacco: Never Used  Substance Use Topics  . Alcohol use: Yes    Comment: occasional   . Drug use: Not on file    Home Medications Prior to Admission medications   Medication Sig Start Date End Date Taking? Authorizing Provider  Ibuprofen 200 MG CAPS  03/24/19   [provider]    meloxicam (MOBIC) 15 MG tablet Take 1 tablet (15 mg total) by mouth daily. 08/23/19   Edrick Kins, DPM    Allergies    Sumatriptan  Review of Systems   Review of Systems  Constitutional: Negative for fatigue and fever.  Gastrointestinal: Positive for abdominal pain and nausea. Negative for hematemesis, hematochezia and vomiting.  All other systems reviewed and are negative.   Physical Exam Updated Vital Signs BP (!) 150/102 (BP Location: Left Arm)   Pulse (!) 105   Temp 98.5 F (36.9 C) (Oral)   Resp 18   Ht 5\' 7"  (1.702 m)   Wt 103.4 kg   SpO2 95%   BMI 35.71 kg/m   Physical Exam Vitals and nursing note reviewed.  Constitutional:      General: She is not in acute distress.    Appearance: She is well-developed. She is not diaphoretic.  HENT:     Head: Normocephalic and atraumatic.  Cardiovascular:     Rate and Rhythm: Normal rate and regular rhythm.     Heart sounds: No murmur. No friction rub. No gallop.   Pulmonary:     Effort: Pulmonary effort is normal. No respiratory distress.     Breath sounds: Normal breath sounds. No wheezing.  Abdominal:     General: Bowel sounds are normal. There is no distension.     Palpations: Abdomen is soft.     Tenderness: There is abdominal tenderness in the epigastric area. There is no right CVA tenderness, left CVA  tenderness, guarding or rebound.  Musculoskeletal:        General: Normal range of motion.     Cervical back: Normal range of motion and neck supple.  Skin:    General: Skin is warm and dry.  Neurological:     Mental Status: She is alert and oriented to person, place, and time.     ED Results / Procedures / Treatments   Labs (all labs ordered are listed, but only abnormal results are displayed) Labs Reviewed  COMPREHENSIVE METABOLIC PANEL - Abnormal; Notable for the following components:      Result Value   Glucose, Bld 127 (*)    Calcium 8.7 (*)    AST 59 (*)    ALT 49 (*)    All other components within  normal limits  CBC - Abnormal; Notable for the following components:   Hemoglobin 15.3 (*)    All other components within normal limits  LIPASE, BLOOD  URINALYSIS, ROUTINE W REFLEX MICROSCOPIC    EKG None  Radiology No results found.  Procedures Procedures (including critical care time)  Medications Ordered in ED Medications  sodium chloride 0.9 % bolus 1,000 mL (has no administration in time range)  ondansetron (ZOFRAN) injection 4 mg (has no administration in time range)  ketorolac (TORADOL) 30 MG/ML injection 30 mg (has no administration in time range)  morphine 4 MG/ML injection 4 mg (has no administration in time range)    ED Course  I have reviewed the triage vital signs and the nursing notes.  Pertinent labs & imaging results that were available during my care of the patient were reviewed by me and considered in my medical decision making (see chart for details).    MDM Rules/Calculators/A&P  Patient presenting with a 3-day history of intermittent abdominal cramping and persistent diarrhea.  Her laboratory studies are reassuring, but CT scan does show colitis of the ascending colon.  She will be treated with Cipro and Flagyl, pain medication, and is to follow-up as needed if symptoms worsen or change.  She is afebrile and abdomen is benign.  Final Clinical Impression(s) / ED Diagnoses Final diagnoses:  None    Rx / DC Orders ED Discharge Orders    None       Veryl Speak, MD 09/01/19 1057

## 2020-11-05 ENCOUNTER — Emergency Department: Payer: BC Managed Care – PPO

## 2020-11-05 ENCOUNTER — Emergency Department
Admission: EM | Admit: 2020-11-05 | Discharge: 2020-11-05 | Disposition: A | Discharge status: Home / Self Care | Payer: BC Managed Care – PPO | Attending: Emergency Medicine | Admitting: Emergency Medicine

## 2020-11-05 DIAGNOSIS — I10 Essential (primary) hypertension: Secondary | ICD-10-CM | POA: Insufficient documentation

## 2020-11-05 DIAGNOSIS — Z79899 Other long term (current) drug therapy: Secondary | ICD-10-CM | POA: Diagnosis not present

## 2020-11-05 DIAGNOSIS — Z859 Personal history of malignant neoplasm, unspecified: Secondary | ICD-10-CM | POA: Insufficient documentation

## 2020-11-05 DIAGNOSIS — R002 Palpitations: Secondary | ICD-10-CM

## 2020-11-05 HISTORY — DX: Autoimmune thyroiditis: E06.3

## 2020-11-05 LAB — BASIC METABOLIC PANEL
Anion gap: 9 (ref 5–15)
BUN: 13 mg/dL (ref 6–20)
CO2: 29 mmol/L (ref 22–32)
Calcium: 9 mg/dL (ref 8.9–10.3)
Chloride: 100 mmol/L (ref 98–111)
Creatinine, Ser: 0.84 mg/dL (ref 0.44–1.00)
GFR, Estimated: >60 mL/min (ref >60)
Glucose, Bld: 114 mg/dL — ABNORMAL HIGH (ref 70–99)
Potassium: 3.6 mmol/L (ref 3.5–5.1)
Sodium: 138 mmol/L (ref 135–145)

## 2020-11-05 LAB — CBC
HCT: 43.8 % (ref 36.0–46.0)
Hemoglobin: 14.5 g/dL (ref 12.0–15.0)
MCH: 28.2 pg (ref 26.0–34.0)
MCHC: 33.1 g/dL (ref 30.0–36.0)
MCV: 85 fL (ref 80.0–100.0)
Platelets: 350 10*3/uL (ref 150–400)
RBC: 5.15 MIL/uL — ABNORMAL HIGH (ref 3.87–5.11)
RDW: 13.9 % (ref 11.5–15.5)
WBC: 9.4 10*3/uL (ref 4.0–10.5)
nRBC: 0 % (ref 0.0–0.2)

## 2020-11-05 LAB — TSH: TSH: 3.969 u[IU]/mL (ref 0.350–4.500)

## 2020-11-05 LAB — D-DIMER, QUANTITATIVE: D-Dimer, Quant: 0.28 ug/mL-FEU (ref 0.00–0.50)

## 2020-11-05 LAB — TROPONIN I (HIGH SENSITIVITY)
Troponin I (High Sensitivity): 3 ng/L (ref <18)
Troponin I (High Sensitivity): 3 ng/L (ref <18)

## 2020-11-05 LAB — T4, FREE: Free T4: 0.95 ng/dL (ref 0.61–1.12)

## 2020-11-05 MED ORDER — ACETAMINOPHEN 500 MG PO TABS
1000.0000 mg | ORAL_TABLET | Freq: Once | ORAL | Status: AC
  Administered 2020-11-05: 1000 mg via ORAL
  Filled 2020-11-05: qty 2

## 2020-11-05 NOTE — ED Provider Notes (Addendum)
[facility] Emergency Department Provider Note  ____________________________________________   Event Date/Time   First MD Initiated Contact with Patient 11/05/20 1305     (approximate)  I have reviewed the triage vital signs and the nursing notes.   HISTORY  Chief Complaint Hypertension    HPI Joanne Rowland is a 45 y.o. female with Hashimoto's who comes in due to hypertension.  Patient reports that her blood pressure at home was 157/114.  Patient also reports a history of palpitations for the past 2 weeks.  These palpitations occur at nighttime.  They occur when she lays a certain way.  She states that today she woke up and was doing her normal routine when she developed a chest tightness with breathing and had a tingling sensation in her lips.  States that she just felt off.  She also reports a moderate headache but not severe sudden onset.  Has a history of migraines but states it felt different.  She checked her blood pressure and it was high.  She initially called her primary care doctor who recommended that she come to the ER to make sure there was not a cardiac event.  This occurred around 9:30 AM.  She denies any history of heart issues in the past.  She is on hydrochlorothiazide 12.5 mg for hypertension.  She reports being compliant with that denies any abdominal pain, leg swelling.  Has had recent long travel to [place] and a cruise.            Past Medical History:  Diagnosis Date   Cancer [facility])    Hashimoto's disease     There are no problems to display for this patient.   Past Surgical History:  Procedure Laterality Date   ABDOMINAL HYSTERECTOMY     CHOLECYSTECTOMY      Prior to Admission medications   Medication Sig Start Date End Date Taking? Authorizing Provider  ciprofloxacin (CIPRO) 500 MG tablet Take 1 tablet (500 mg total) by mouth 2 (two) times daily. One po bid x 7 days 09/01/19   Veryl Speak, MD   HYDROcodone-acetaminophen (NORCO) 5-325 MG tablet Take 1-2 tablets by mouth every 6 (six) hours as needed. 09/01/19   Veryl Speak, MD  Ibuprofen 200 MG CAPS  03/24/19   [provider]  meloxicam (MOBIC) 15 MG tablet Take 1 tablet (15 mg total) by mouth daily. Patient not taking: Reported on 09/01/2019 08/23/19   Edrick Kins, DPM  metroNIDAZOLE (FLAGYL) 500 MG tablet Take 1 tablet (500 mg total) by mouth 3 (three) times daily. One po tid x 7 days 09/01/19   Veryl Speak, MD    Allergies Sumatriptan  No family history on file.  Social History Social History   Tobacco Use   Smoking status: Never   Smokeless tobacco: Never  Vaping Use   Vaping Use: Never used  Substance Use Topics   Alcohol use: Yes    Comment: occasional       Review of Systems Constitutional: No fever/chills Eyes: No visual changes. ENT: No sore throat. Cardiovascular: Positive chest pain Respiratory: Chest tightness with breathing Gastrointestinal: No abdominal pain.  No nausea, no vomiting.  No diarrhea.  No constipation. Genitourinary: Negative for dysuria. Musculoskeletal: Negative for back pain. Skin: Negative for rash. Neurological: Negative for headaches, focal weakness or numbness.  Tingling in lips now resolved All other ROS negative ____________________________________________   PHYSICAL EXAM:  VITAL SIGNS: ED Triage Vitals  Enc Vitals Group  BP 11/05/20 1216 (!) 151/108     Pulse Rate 11/05/20 1216 84     Resp 11/05/20 1216 20     Temp 11/05/20 1216 98.1 F (36.7 C)     Temp Source 11/05/20 1216 Oral     SpO2 11/05/20 1216 97 %     Weight 11/05/20 1216 228 lb (103.4 kg)     Height 11/05/20 1216 '5\' 7"'$  (1.702 m)     Head Circumference --      Peak Flow --      Pain Score 11/05/20 1224 0     Pain Loc --      Pain Edu? --      Excl. in [place]? --     Constitutional: Alert and oriented. Well appearing and in no acute distress. Eyes: Conjunctivae are normal. EOMI.  pupils reactive bilaterally Head: Atraumatic. Nose: No congestion/rhinnorhea. Mouth/Throat: Mucous membranes are moist.   Neck: No stridor. Trachea Midline. FROM Cardiovascular: Normal rate, regular rhythm. Grossly normal heart sounds.  Good peripheral circulation. Respiratory: Normal respiratory effort.  No retractions. Lungs CTAB. Gastrointestinal: Soft and nontender. No distention. No abdominal bruits.  Musculoskeletal: No lower extremity tenderness nor edema.  No joint effusions. Neurologic:  Normal speech and language.  Cranial nerves are intact..  Equal strength in arms and legs.  No pronator drift Skin:  Skin is warm, dry and intact. No rash noted. Psychiatric: Mood and affect are normal. Speech and behavior are normal. GU: Deferred   ____________________________________________   LABS (all labs ordered are listed, but only abnormal results are displayed)  Labs Reviewed  BASIC METABOLIC PANEL - Abnormal; Notable for the following components:      Result Value   Glucose, Bld 114 (*)    All other components within normal limits  CBC - Abnormal; Notable for the following components:   RBC 5.15 (*)    All other components within normal limits  T4, FREE  TSH  D-DIMER, QUANTITATIVE  POC URINE PREG, ED  TROPONIN I (HIGH SENSITIVITY)  TROPONIN I (HIGH SENSITIVITY)   ____________________________________________   ED ECG REPORT I, Vanessa Ruston, the attending physician, personally viewed and interpreted this ECG.  Normal sinus rate 89, no ST elevation, T wave version lead III and aVF, normal intervals.  No prior EKG to compare to ____________________________________________  RADIOLOGY I, Vanessa [place], personally viewed and evaluated these images (plain radiographs) as part of my medical decision making, as well as reviewing the written report by the radiologist.  ED MD interpretation: No pneumonia  Official radiology report(s): DG Chest 2 View  Result Date:  11/05/2020 CLINICAL DATA:  Hypertension. EXAM: CHEST - 2 VIEW COMPARISON:  Report from prior chest radiographs 04/10/2020 (images unavailable). FINDINGS: Heart size within normal limits. No appreciable airspace consolidation. No evidence of pleural effusion or pneumothorax. No acute bony abnormality identified. IMPRESSION: No evidence of active cardiopulmonary disease. Electronically Signed   By: Kellie Simmering DO   On: 11/05/2020 13:23    ____________________________________________   PROCEDURES  Procedure(s) performed (including Critical Care):  Procedures   ____________________________________________   INITIAL IMPRESSION / ASSESSMENT AND PLAN / ED COURSE  Joanne Rowland was evaluated in Emergency Department on 11/05/2020 for the symptoms described in the history of present illness. She was evaluated in the context of the global COVID-19 pandemic, which necessitated consideration that the patient might be at risk for infection with the SARS-CoV-2 virus that causes COVID-19. Institutional protocols and algorithms that pertain to the evaluation of patients  at risk for COVID-19 are in a state of rapid change based on information released by regulatory bodies including the CDC and federal and state organizations. These policies and algorithms were followed during the patient's care in the ED.    Patient comes in with a chest tightness sensation, palpitations for 2 weeks and a little bit of tingling in her lips.  Her symptoms have mostly resolved except for little bit of chest tightness still.  Her oxygen level was satted at 93%.  She reports having some low oxygen levels after having COVID in January but she is not sure if this could still just be that.  However given her recent long travel will get D-dimer to evaluate for pulmonary embolism.  No evidence of stroke upon examination.  Cardiac markers ordered evaluate for ACS.  EKG without evidence of arrhythmia.  Patient's labs are reassuring.  Cardiac  markers are negative x2.  D-dimer is negative so low suspicion for PE.  Thyroid levels are normal.  Reevaluated patient sats are 95% and she continues to deny any shortness of breath.  She her chest pain is now since all resolved she does report having a moderate headache but states that this is not as severe as when she normally gets her migraines and declines further medications other than the Tylenol that was already given. She does report some mild distance blurry and pain with light but reports recent eye doc evaluation that was normal. No evidence of papilledema.  At this time she feels comfortable going home.  We will give her cardiology follow-up for discussion of Holter monitor.  Patient understands that if her chest pain comes back that she can return to the ER for repeat evaluation but at this time she feels comfortable with being discharged home  I discussed the provisional nature of ED diagnosis, the treatment so far, the ongoing plan of care, follow up appointments and return precautions with the patient and any family or support people present. They expressed understanding and agreed with the plan, discharged home.          ____________________________________________   FINAL CLINICAL IMPRESSION(S) / ED DIAGNOSES   Final diagnoses:  Hypertension, unspecified type  Palpitations      MEDICATIONS GIVEN DURING THIS VISIT:  Medications  acetaminophen (TYLENOL) tablet 1,000 mg (1,000 mg Oral Given 11/05/20 1402)     ED Discharge Orders     None        Note:  This document was prepared using Dragon voice recognition software and may include unintentional dictation errors.    Vanessa Milling[place], MD 11/05/20 1501    Vanessa Trimble, MD 11/05/20 [phone]

## 2020-11-05 NOTE — Discharge Instructions (Addendum)
Your work-up is reassuring.  No evidence of heart attack or blood clot.  Your thyroid levels were normal.  You should discuss your symptoms further with your primary care doctor and you can follow-up with cardiology to discuss Holter monitor for your palpitations.   return to the ER if you develop worsening symptoms or any other concerns.  This time I would not adjust her blood pressure medication given her blood pressures currently look pretty good at 131/93 however if your blood pressures are staying above 140/100 over the next few days she could consider doubling your dose of blood pressure medicine.  SHe can discuss this further with her primary doctor.

## 2020-11-05 NOTE — ED Triage Notes (Signed)
Patient presents to ER from home. Patient reports PCP requested patient come to hospital d/t hypertension and hx of Hashimoto. Patient reports BP at home was 157/114. Patient reports taking all morning home medications as normal. Patient reports hx of palpitations for 2 weeks. Patient A&OX3.

## 2021-09-06 ENCOUNTER — Encounter: Payer: Self-pay | Admitting: *Deleted

## 2021-09-06 ENCOUNTER — Encounter: Payer: No Typology Code available for payment source | Attending: Family Medicine | Admitting: *Deleted

## 2021-09-06 VITALS — BP 130/88 | Ht 66.5 in | Wt 240.7 lb

## 2021-09-06 DIAGNOSIS — E119 Type 2 diabetes mellitus without complications: Secondary | ICD-10-CM | POA: Diagnosis present

## 2021-09-06 DIAGNOSIS — Z713 Dietary counseling and surveillance: Secondary | ICD-10-CM | POA: Insufficient documentation

## 2021-09-06 NOTE — Progress Notes (Signed)
Diabetes Self-Management Education  Visit Type: First/Initial  Appt. Start Time: 1320 Appt. End Time: 8250  09/06/2021  Joanne Rowland, identified by name and date of birth, is a 46 y.o. female with a diagnosis of Diabetes: Type 2.   ASSESSMENT  Blood pressure 130/88, height 5' 6.5" (1.689 m), weight 240 lb 11.2 oz (109.2 kg). Body mass index is 38.27 kg/m.   Diabetes Self-Management Education - 09/06/21 1451       Visit Information   Visit Type First/Initial      Initial Visit   Diabetes Type Type 2    Date Diagnosed 2 weeks ago    Are you currently following a meal plan? Yes    What type of meal plan do you follow? "low carb"    Are you taking your medications as prescribed? Yes      Health Coping   How would you rate your overall health? Poor      Psychosocial Assessment   Patient Belief/Attitude about Diabetes Defeat/Burnout   "terrible"   What is the hardest part about your diabetes right now, causing you the most concern, or is the most worrisome to you about your diabetes?   Being active   Pt used to run 5 and 10 K's   Self-care barriers None    Self-management support Doctor's office;Family    Patient Concerns Nutrition/Meal planning;Glycemic Control;Medication;Monitoring;Weight Control;Healthy Lifestyle;Problem Solving    Special Needs None    Preferred Learning Style Auditory;Other (comment)   talking/discussion   Learning Readiness Change in progress    How often do you need to have someone help you when you read instructions, pamphlets, or other written materials from your doctor or pharmacy? 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 Needs Instruction    Patient understands incorporating nutritional management into lifestyle. Needs Instruction    Patient undertands incorporating physical activity into lifestyle. Needs  Instruction    Patient understands using medications safely. Needs Instruction    Patient understands monitoring blood glucose, interpreting and using results Needs Instruction    Patient understands prevention, detection, and treatment of acute complications. Needs Instruction    Patient understands prevention, detection, and treatment of chronic complications. Needs Review    Patient understands how to develop strategies to address psychosocial issues. Needs Instruction    Patient understands how to develop strategies to promote health/change behavior. Needs Instruction      Complications   Last HgB A1C per patient/outside source 7.5 %   08/22/2021   How often do you check your blood sugar? Patient declines    Have you had a dilated eye exam in the past 12 months? Yes    Have you had a dental exam in the past 12 months? Yes    Are you checking your feet? No      Dietary Intake   Breakfast skips    Lunch salads with lettuce, broccoli, tomatoes, peppers, olives, carrots, cauliflower, dressing; egg salad sandwich with cuccumbers    Snack (afternoon) 0-1 snacks/day - fruit (watermelon, grapes, cheeries, plums, bananas, oranges, grapefruit); eggs, string cheese    Dinner chicken with potatoes, carrots, onions or with black beans, rice, chilies; salmon and rice; beef, peas, corn, asparagus, green beans, occasional pasta    Beverage(s) water, diet soda      Activity / Exercise   Activity /  Exercise Type ADL's      Patient Education   Previous Diabetes Education No    Disease Pathophysiology Definition of diabetes, type 1 and 2, and the diagnosis of diabetes;Factors that contribute to the development of diabetes;Explored patient's options for treatment of their diabetes    Healthy Eating Role of diet in the treatment of diabetes and the relationship between the three main macronutrients and blood glucose level;Food label reading, portion sizes and measuring food.    Being Active Role of  exercise on diabetes management, blood pressure control and cardiac health.    Monitoring Identified appropriate SMBG and/or A1C goals.    Chronic complications Relationship between chronic complications and blood glucose control    Diabetes Stress and Support Identified and addressed patients feelings and concerns about diabetes;Role of stress on diabetes      Individualized Goals (developed by patient)   Reducing Risk Other (comment)   improve blood sugars, decrease medications, prevent diabetes complications, lose weight, lead a healthier lifestyle, become more fit     Outcomes   Expected Outcomes Demonstrated interest in learning. Expect positive outcomes    Future DMSE 2 wks         Individualized Plan for Diabetes Self-Management Training:   Learning Objective:  Patient will have a greater understanding of diabetes self-management. Patient education plan is to attend individual and/or group sessions per assessed needs and concerns.   Plan:   Patient Instructions  Exercise:   Begin walking for    10  minutes   3  days a week and gradually increase to 30 minutes 5 x week  Eat 3 meals day,   1-2  snacks a day Space meals 4-6 hours apart Don't skip meals - eat at least 1 protein and 1 carbohydrate serving Include 1 serving of protein when eating fruit for a snack  Return for classes on:     Expected Outcomes:  Demonstrated interest in learning. Expect positive outcomes  Education material provided:  General Meal Planning Guidelines Simple Meal Plan Healthy Snack Choices (ADA)  If problems or questions, patient to contact team via:   Johny Drilling, RN, [place] [phone]  Future DSME appointment: 2 wks September 16, 2021 for Diabetes Class 1

## 2021-09-06 NOTE — Patient Instructions (Signed)
Exercise:   Begin walking for    10  minutes   3  days a week and gradually increase to 30 minutes 5 x week  Eat 3 meals day,   1-2  snacks a day Space meals 4-6 hours apart Don't skip meals - eat at least 1 protein and 1 carbohydrate serving Include 1 serving of protein when eating fruit for a snack  Return for classes on:

## 2021-09-16 ENCOUNTER — Encounter: Payer: Self-pay | Admitting: *Deleted

## 2021-09-16 ENCOUNTER — Encounter: Payer: No Typology Code available for payment source | Admitting: *Deleted

## 2021-09-16 VITALS — Wt 238.9 lb

## 2021-09-16 DIAGNOSIS — E119 Type 2 diabetes mellitus without complications: Secondary | ICD-10-CM

## 2021-09-16 DIAGNOSIS — Z713 Dietary counseling and surveillance: Secondary | ICD-10-CM | POA: Diagnosis not present

## 2021-09-16 NOTE — Progress Notes (Signed)

## 2021-09-30 ENCOUNTER — Encounter: Payer: Self-pay | Admitting: *Deleted

## 2021-09-30 ENCOUNTER — Encounter: Payer: No Typology Code available for payment source | Admitting: *Deleted

## 2021-09-30 VITALS — BP 118/74 | Wt 234.8 lb

## 2021-09-30 DIAGNOSIS — Z713 Dietary counseling and surveillance: Secondary | ICD-10-CM | POA: Diagnosis not present

## 2021-09-30 DIAGNOSIS — E119 Type 2 diabetes mellitus without complications: Secondary | ICD-10-CM

## 2022-01-06 ENCOUNTER — Encounter: Payer: No Typology Code available for payment source | Attending: Family Medicine

## 2022-01-06 DIAGNOSIS — E119 Type 2 diabetes mellitus without complications: Secondary | ICD-10-CM | POA: Insufficient documentation

## 2022-01-06 DIAGNOSIS — Z713 Dietary counseling and surveillance: Secondary | ICD-10-CM | POA: Insufficient documentation

## 2022-01-16 ENCOUNTER — Encounter: Payer: Self-pay | Admitting: *Deleted

## 2022-04-26 ENCOUNTER — Other Ambulatory Visit: Payer: Self-pay

## 2022-04-26 ENCOUNTER — Encounter: Payer: Self-pay | Admitting: Emergency Medicine

## 2022-04-26 ENCOUNTER — Emergency Department
Admission: EM | Admit: 2022-04-26 | Discharge: 2022-04-26 | Disposition: A | Discharge status: Home / Self Care | Payer: No Typology Code available for payment source | Attending: Emergency Medicine | Admitting: Emergency Medicine

## 2022-04-26 DIAGNOSIS — R112 Nausea with vomiting, unspecified: Secondary | ICD-10-CM | POA: Insufficient documentation

## 2022-04-26 LAB — CBC
HCT: 51.1 % — ABNORMAL HIGH (ref 36.0–46.0)
Hemoglobin: 16.8 g/dL — ABNORMAL HIGH (ref 12.0–15.0)
MCH: 29.9 pg (ref 26.0–34.0)
MCHC: 32.9 g/dL (ref 30.0–36.0)
MCV: 90.9 fL (ref 80.0–100.0)
Platelets: 440 10*3/uL — ABNORMAL HIGH (ref 150–400)
RBC: 5.62 MIL/uL — ABNORMAL HIGH (ref 3.87–5.11)
RDW: 13 % (ref 11.5–15.5)
WBC: 12.9 10*3/uL — ABNORMAL HIGH (ref 4.0–10.5)
nRBC: 0 % (ref 0.0–0.2)

## 2022-04-26 LAB — LIPASE, BLOOD: Lipase: 62 U/L — ABNORMAL HIGH (ref 11–51)

## 2022-04-26 LAB — COMPREHENSIVE METABOLIC PANEL
ALT: 18 U/L (ref 0–44)
AST: 15 U/L (ref 15–41)
Albumin: 4.2 g/dL (ref 3.5–5.0)
Alkaline Phosphatase: 71 U/L (ref 38–126)
Anion gap: 9 (ref 5–15)
BUN: 20 mg/dL (ref 6–20)
CO2: 30 mmol/L (ref 22–32)
Calcium: 9.6 mg/dL (ref 8.9–10.3)
Chloride: 99 mmol/L (ref 98–111)
Creatinine, Ser: 1.09 mg/dL — ABNORMAL HIGH (ref 0.44–1.00)
GFR, Estimated: >60 mL/min (ref >60)
Glucose, Bld: 94 mg/dL (ref 70–99)
Potassium: 3.9 mmol/L (ref 3.5–5.1)
Sodium: 138 mmol/L (ref 135–145)
Total Bilirubin: 0.7 mg/dL (ref 0.3–1.2)
Total Protein: 8.4 g/dL — ABNORMAL HIGH (ref 6.5–8.1)

## 2022-04-26 MED ORDER — ONDANSETRON HCL 4 MG PO TABS: 4.0000 mg | ORAL_TABLET | Freq: Three times a day (TID) | ORAL | 0 refills | Status: DC | PRN

## 2022-04-26 MED ORDER — PROCHLORPERAZINE EDISYLATE 10 MG/2ML IJ SOLN
10.0000 mg | Freq: Once | INTRAMUSCULAR | Status: AC
  Administered 2022-04-26: 10 mg via INTRAVENOUS
  Filled 2022-04-26: qty 2

## 2022-04-26 MED ORDER — ONDANSETRON HCL 4 MG/2ML IJ SOLN
4.0000 mg | Freq: Once | INTRAMUSCULAR | Status: AC
  Administered 2022-04-26: 4 mg via INTRAVENOUS
  Filled 2022-04-26: qty 2

## 2022-04-26 MED ORDER — SODIUM CHLORIDE 0.9 % IV BOLUS
1000.0000 mL | Freq: Once | INTRAVENOUS | Status: AC
  Administered 2022-04-26: 1000 mL via INTRAVENOUS

## 2022-04-26 NOTE — Discharge Instructions (Signed)
Please seek medical attention for any high fevers, chest pain, shortness of breath, change in behavior, persistent vomiting, bloody stool or any other new or concerning symptoms.  

## 2022-04-26 NOTE — ED Notes (Signed)
Pt states HA still 9/10 but is not vomiting. Placed new IV for IV fluids, other was infiltrated. Repositioned in bed and lights dimmed.

## 2022-04-26 NOTE — ED Triage Notes (Signed)
Pt to ER with c/o vomiting since this AM.  Pt denies abdominal pain or diarrhea.  States she has developed a headache from the vomiting.

## 2022-04-26 NOTE — ED Provider Notes (Signed)
[facility] Provider Note    Event Date/Time   First MD Initiated Contact with Patient 04/26/22 1657     (approximate)   History   Emesis   HPI  Joanne Rowland is a 47 y.o. female  who presents to the emergency department today because of concern for nausea and vomiting. Patient has been having nausea for the whole day. She has some diffuse discomfort in her abdomen but no pain. The aptient denies any blood in her vomit. No diarrhea. No fever or chills. Patient is concerned that it is either related to something she ate last night, taking a higher dose of ozempic, or possibly related to COVID/paxlovid use, which she was diagnosed with 5 days ago. Along with the nausea and vomiting she has now developed some headache.       Physical Exam   Triage Vital Signs: ED Triage Vitals  Enc Vitals Group     BP 04/26/22 1548 (!) 127/95     Pulse Rate 04/26/22 1548 90     Resp 04/26/22 1548 18     Temp --      Temp src --      SpO2 04/26/22 1548 100 %     Weight 04/26/22 1549 213 lb (96.6 kg)     Height 04/26/22 1549 '5\' 7"'$  (1.702 m)     Head Circumference --      Peak Flow --      Pain Score 04/26/22 1548 8     Pain Loc --      Pain Edu? --      Excl. in [place]? --     Most recent vital signs: Vitals:   04/26/22 1548  BP: (!) 127/95  Pulse: 90  Resp: 18  SpO2: 100%   General: Awake, alert, oriented. CV:  Good peripheral perfusion. Regular rate and rhythm. Resp:  Normal effort. Lungs clear. Abd:  No distention.     ED Results / Procedures / Treatments   Labs (all labs ordered are listed, but only abnormal results are displayed) Labs Reviewed  LIPASE, BLOOD - Abnormal; Notable for the following components:      Result Value   Lipase 62 (*)    All other components within normal limits  COMPREHENSIVE METABOLIC PANEL - Abnormal; Notable for the following components:   Creatinine, Ser 1.09 (*)    Total Protein 8.4 (*)    All other components within  normal limits  CBC - Abnormal; Notable for the following components:   WBC 12.9 (*)    RBC 5.62 (*)    Hemoglobin 16.8 (*)    HCT 51.1 (*)    Platelets 440 (*)    All other components within normal limits  URINALYSIS, ROUTINE W REFLEX MICROSCOPIC     EKG  None   RADIOLOGY None   PROCEDURES:  Critical Care performed: No  Procedures   MEDICATIONS ORDERED IN ED: Medications - No data to display   IMPRESSION / MDM / [place] / ED COURSE  I reviewed the triage vital signs and the nursing notes.                              Differential diagnosis includes, but is not limited to, viral gastroenteritis, food poisoning, pancreatitis, obstruction.  Patient's presentation is most consistent with acute presentation with potential threat to life or bodily function.    Patient presented to the emergency department  today because of concerns for nausea and vomiting.  No significant abdominal pain.  On exam patient without any focal tenderness.  Afebrile.  Blood work showed a mild leukocytosis some mild elevation of her lipase.  At this time I think likely related to the vomiting.  I have low suspicion for true pancreatitis given lack of significant epigastric pain.  Patient also complaining of some headache.  She was given IV fluids and medication.  She did feel improvement.  She felt comfortable with discharge home after improvement.  I think this is reasonable.  We did discuss return precautions.   FINAL CLINICAL IMPRESSION(S) / ED DIAGNOSES   Final diagnoses:  Nausea and vomiting, unspecified vomiting type      Note:  This document was prepared using Dragon voice recognition software and may include unintentional dictation errors.    Nance Pear, MD 04/26/22 Despina Pole

## 2022-05-10 ENCOUNTER — Other Ambulatory Visit: Payer: Self-pay

## 2022-05-10 ENCOUNTER — Emergency Department: Payer: No Typology Code available for payment source

## 2022-05-10 ENCOUNTER — Emergency Department
Admission: EM | Admit: 2022-05-10 | Discharge: 2022-05-10 | Disposition: A | Discharge status: Home / Self Care | Payer: No Typology Code available for payment source | Attending: Emergency Medicine | Admitting: Emergency Medicine

## 2022-05-10 DIAGNOSIS — R110 Nausea: Secondary | ICD-10-CM | POA: Diagnosis not present

## 2022-05-10 DIAGNOSIS — K5900 Constipation, unspecified: Secondary | ICD-10-CM | POA: Insufficient documentation

## 2022-05-10 LAB — COMPREHENSIVE METABOLIC PANEL
ALT: 23 U/L (ref 0–44)
AST: 24 U/L (ref 15–41)
Albumin: 4.3 g/dL (ref 3.5–5.0)
Alkaline Phosphatase: 59 U/L (ref 38–126)
Anion gap: 8 (ref 5–15)
BUN: 11 mg/dL (ref 6–20)
CO2: 29 mmol/L (ref 22–32)
Calcium: 9.5 mg/dL (ref 8.9–10.3)
Chloride: 103 mmol/L (ref 98–111)
Creatinine, Ser: 0.85 mg/dL (ref 0.44–1.00)
GFR, Estimated: >60 mL/min (ref >60)
Glucose, Bld: 119 mg/dL — ABNORMAL HIGH (ref 70–99)
Potassium: 3.6 mmol/L (ref 3.5–5.1)
Sodium: 140 mmol/L (ref 135–145)
Total Bilirubin: 0.8 mg/dL (ref 0.3–1.2)
Total Protein: 8 g/dL (ref 6.5–8.1)

## 2022-05-10 LAB — CBC WITH DIFFERENTIAL/PLATELET
Abs Immature Granulocytes: 0.02 10*3/uL (ref 0.00–0.07)
Basophils Absolute: 0.1 10*3/uL (ref 0.0–0.1)
Basophils Relative: 1 %
Eosinophils Absolute: 0.4 10*3/uL (ref 0.0–0.5)
Eosinophils Relative: 4 %
HCT: 44.3 % (ref 36.0–46.0)
Hemoglobin: 14.9 g/dL (ref 12.0–15.0)
Immature Granulocytes: 0 %
Lymphocytes Relative: 24 %
Lymphs Abs: 3 10*3/uL (ref 0.7–4.0)
MCH: 30.5 pg (ref 26.0–34.0)
MCHC: 33.6 g/dL (ref 30.0–36.0)
MCV: 90.6 fL (ref 80.0–100.0)
Monocytes Absolute: 0.6 10*3/uL (ref 0.1–1.0)
Monocytes Relative: 5 %
Neutro Abs: 8.5 10*3/uL — ABNORMAL HIGH (ref 1.7–7.7)
Neutrophils Relative %: 66 %
Platelets: 341 10*3/uL (ref 150–400)
RBC: 4.89 MIL/uL (ref 3.87–5.11)
RDW: 12.7 % (ref 11.5–15.5)
WBC: 12.7 10*3/uL — ABNORMAL HIGH (ref 4.0–10.5)
nRBC: 0 % (ref 0.0–0.2)

## 2022-05-10 MED ORDER — DOCUSATE SODIUM 50 MG/5ML PO LIQD
100.0000 mg | ORAL | Status: AC
  Administered 2022-05-10: 100 mg via ORAL
  Filled 2022-05-10: qty 10

## 2022-05-10 MED ORDER — LACTULOSE 10 GM/15ML PO SOLN
20.0000 g | Freq: Once | ORAL | Status: AC
  Administered 2022-05-10: 20 g via ORAL
  Filled 2022-05-10: qty 30

## 2022-05-10 MED ORDER — MAGNESIUM CITRATE PO SOLN
0.5000 | Freq: Once | ORAL | Status: AC
  Administered 2022-05-10: 0.5
  Filled 2022-05-10: qty 296

## 2022-05-10 MED ORDER — IOHEXOL 300 MG/ML  SOLN
100.0000 mL | Freq: Once | INTRAMUSCULAR | Status: AC | PRN
  Administered 2022-05-10: 100 mL via INTRAVENOUS

## 2022-05-10 NOTE — ED Provider Notes (Signed)
[facility] Provider Note    Event Date/Time   First MD Initiated Contact with Patient 05/10/22 0138     (approximate)   History   Constipation and Nausea   HPI  Joanne Rowland is a 47 y.o. female who presents for evaluation of constipation and bloating.  She said that she has been having some issues for a while.  She was seen for similar symptoms about 2 weeks ago.  She said that she has had a difficult time over the last couple weeks because she was diagnosed with COVID and has also been on Ozempic including an increased dosage which may have led to some increased nausea and vomiting.  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  She has no significant history of abdominal surgery and has never had an obstruction but she was concerned that she may be obstructed.  She is going to be leaving for a cruise in about 3 days and wants to make sure she is as healthy as possible before she goes.  She has noticed some blood after she tries to have bowel movements.  She has passed very small amounts of firm hard stool and she has taken over-the-counter stool softeners and laxatives and tried using her own enemas but it has not been successful.  She started to feel nauseated today but has not had any vomiting.  No recent fevers.  No difficulty breathing.     Physical Exam   Triage Vital Signs: ED Triage Vitals [05/10/22 0121]  Enc Vitals Group     BP 134/89     Pulse Rate 82     Resp 16     Temp 97.6 F (36.4 C)     Temp Source Oral     SpO2 98 %     Weight 96.6 kg (212 lb 15.4 oz)     Height 1.702 m ('5\' 7"'$ )     Head Circumference      Peak Flow      Pain Score 0     Pain Loc      Pain Edu?      Excl. in [place]?     Most recent vital signs: Vitals:   05/10/22 0500 05/10/22 0530  BP: (!) 99/53 (!) 129/90  Pulse: 76 75  Resp:  16  Temp:    SpO2: 98% 100%      General: Awake, no distress.  CV:  Good peripheral perfusion.  Resp:  Normal effort. Speaking easily and comfortably, no accessory muscle usage nor intercostal retractions.   Abd:  No distention.  No tenderness to palpation of her abdomen. Other:  Rectal exam, chaperoned by ED nurse, demonstrates no substantial amount of stool in the rectal vault.   ED Results / Procedures / Treatments   Labs (all labs ordered are listed, but only abnormal results are displayed) Labs Reviewed  CBC WITH DIFFERENTIAL/PLATELET - Abnormal; Notable for the following components:      Result Value   WBC 12.7 (*)    Neutro Abs 8.5 (*)    All other components within normal limits  COMPREHENSIVE METABOLIC PANEL - Abnormal; Notable for the following components:   Glucose, Bld 119 (*)    All other components within normal limits     RADIOLOGY I viewed and interpreted the patient's acute abdomen series with chest x-rays.  I can see formed stool over on  the right side of her colon but no air-fluid levels to suggest an obstructive process.  Radiologist agrees that there are no acute or emergent findings.    PROCEDURES:  Critical Care performed: No  Procedures   MEDICATIONS ORDERED IN ED: Medications  lactulose (CHRONULAC) 10 GM/15ML solution 20 g (20 g Oral Given 05/10/22 0302)  magnesium citrate solution 0.5 Bottle (0.5 Bottles Per Tube Given 05/10/22 0303)  docusate (COLACE) 50 MG/5ML liquid 100 mg (100 mg Oral Given 05/10/22 0302)  iohexol (OMNIPAQUE) 300 MG/ML solution 100 mL (100 mLs Intravenous Contrast Given 05/10/22 0534)     IMPRESSION / MDM / ASSESSMENT AND PLAN / ED COURSE  I reviewed the triage vital signs and the nursing notes.                              Differential diagnosis includes, but is not limited to, nonspecific constipation, SBO/ileus, electrolyte or metabolic abnormality, IBD, IBS.  Patient's presentation is most consistent with acute complicated illness / injury  requiring diagnostic workup.  Strongly suspect constipation of unclear etiology.  I reviewed her lab work from prior visit and there was no significant abnormalities that were likely to contribute, such as profound hypokalemia.  No indication to repeat lab work tonight.  X-rays to evaluate for possible obstructive process were reassuring and I can see stool burden including formed stool on the right side of her colon.  I talked with the patient about various options including manual disimpaction, and we agreed to proceed with oral laxative (lactulose 20 g p.o.) as well as a soapsuds enema with added liquid Colace and mag citrate.  I will then reassess if additional treatment is needed.   (Please see Hospital Course for additional medications that may have been given over the course of the ED stay:) Labs/studies ordered: Acute abdomen x-rays with chest Interventions/Medications given: Lactulose 100 mg via enema, lactulose 20 g p.o., one half bottle mag citrate in soapsuds enema   Clinical Course as of 05/10/22 0631  Sat May 10, 2022  0442 Patient has had 3 attempts with enema and has had no success.  She also drank half a bottle of magnesium citrate in addition to the lactulose I ordered, still with no success.  She is feeling increasing nausea but has not had any passage of stool.  With ED nurse chaperone present, I performed a digital rectal exam and there is no blockage or stool ball in the rectum that I can reach or feel on exam.  Given the patient's concerns and persistent discomfort, at this point we will proceed with IV placement, basic labs, Zofran 4 mg IV for nausea, and proceed with CT scan of the abdomen and pelvis to evaluate for the possibility of SBO/ileus.  Patient agrees with the plan.  I will avoid the use of narcotic pain medicine given that this will likely worsen the constipation. [CF]  0605 CT ABDOMEN PELVIS W CONTRAST I viewed and interpreted the patient's CT of the abdomen  and pelvis.  There is no evidence of obstruction but she does have substantial amount of stool including in the right side of the colon.  This is consistent with constipation. [CF]  [id number] I discussed a plan for aggressive "colonoscopy bowel prep" treatment at home.  Given her overall reassuring evaluation, there would be no benefit to hospitalization.  I am not concerned about obstipation or a surgical issue.  She agrees that she would  rather try this at home.  I provided colonoscopy prep instructions using over-the-counter materials and she will try this at home.  I gave my usual and customary return precautions. [CF]    Clinical Course User Index [CF] Hinda Kehr, MD     FINAL CLINICAL IMPRESSION(S) / ED DIAGNOSES   Final diagnoses:  Constipation, unspecified constipation type     Rx / DC Orders   ED Discharge Orders     None        Note:  This document was prepared using Dragon voice recognition software and may include unintentional dictation errors.   Hinda Kehr, MD 05/10/22 [phone]

## 2022-05-10 NOTE — Discharge Instructions (Signed)
You were seen in the emergency department today for constipation.  We recommend that you use one or more of the following over-the-counter medications in the order described:   1)  Miralax (powder):  This medication works by drawing additional fluid into your intestines and helps to flush out your stool.  Mix the powder with water or juice according to label instructions.  Be sure to use the recommended amount of water or juice when you mix up the powder.  Plenty of fluids will help to prevent constipation. 2)  Colace (or Dulcolax) 100 mg:  This is a stool softener, and you may take it once or twice a day as needed. 3)  Senna tablets:  This is a bowel stimulant that will help "push" out your stool. It is the next step to add after you have tried a stool softener.  You may also want to consider using glycerin suppositories, which you insert into your rectum.  You hold it in place and is dissolves and softens your stool and stimulates your bowels.  You could also consider using an enema, which is also available over the counter.  Remember that narcotic pain medications are constipating, so avoid them or minimize their use.  Drink plenty of fluids.  Please return to the Emergency Department immediately if you develop new or worsening symptoms that concern you, such as (but not limited to) fever > 101 degrees, severe abdominal pain, or persistent vomiting.

## 2022-05-10 NOTE — ED Triage Notes (Signed)
Pt to ED from home for constipation, rectal bleeding and nausea. Pt last BM was tonight but was "small nuggets". Pt is CAOx4 and in no acute distress. Ambulatory in triage.

## 2022-05-10 NOTE — ED Triage Notes (Signed)
Pt also advised she has taken all the OTC meds at home to relieve constipation with no relief.

## 2022-06-11 ENCOUNTER — Other Ambulatory Visit: Payer: Self-pay | Admitting: Cardiovascular Disease

## 2022-06-11 DIAGNOSIS — I5189 Other ill-defined heart diseases: Secondary | ICD-10-CM

## 2022-07-16 ENCOUNTER — Other Ambulatory Visit: Payer: Self-pay | Admitting: Cardiovascular Disease

## 2022-07-25 ENCOUNTER — Encounter: Payer: Self-pay | Admitting: Cardiovascular Disease

## 2022-07-25 ENCOUNTER — Ambulatory Visit (INDEPENDENT_AMBULATORY_CARE_PROVIDER_SITE_OTHER): Payer: BC Managed Care – PPO | Admitting: Cardiovascular Disease

## 2022-07-25 VITALS — BP 122/88 | HR 77 | Ht 67.0 in | Wt 221.8 lb

## 2022-07-25 DIAGNOSIS — I4711 Inappropriate sinus tachycardia, so stated: Secondary | ICD-10-CM

## 2022-07-25 DIAGNOSIS — I340 Nonrheumatic mitral (valve) insufficiency: Secondary | ICD-10-CM

## 2022-07-25 DIAGNOSIS — R002 Palpitations: Secondary | ICD-10-CM | POA: Diagnosis not present

## 2022-07-25 NOTE — Progress Notes (Unsigned)
Cardiology Office Note   Date:  07/25/2022   ID:  Joanne Rowland, DOB [birth year], MRN [id number]  PCP:  Wilford Corner, PA-C  Cardiologist:  Adrian Blackwater, MD      History of Present Illness: Joanne Rowland is a 47 y.o. female who presents for  Chief Complaint  Patient presents with   Follow-up    4 month follow up    Occasional palpitation    Past Medical History:  Diagnosis Date   Cancer    Diabetes mellitus without complication    Hashimoto's disease      Past Surgical History:  Procedure Laterality Date   ABDOMINAL HYSTERECTOMY     CHOLECYSTECTOMY       Current Outpatient Medications  Medication Sig Dispense Refill   levothyroxine (SYNTHROID) 125 MCG tablet Take 125 mcg by mouth daily before breakfast.     metoprolol succinate (TOPROL-XL) 100 MG 24 hr tablet TAKE 1 TABLET BY MOUTH EVERY DAY 90 tablet 1   nortriptyline (PAMELOR) 25 MG capsule Take 1 capsule by mouth at bedtime.     pantoprazole (PROTONIX) 40 MG tablet Take 40 mg by mouth daily.     spironolactone (ALDACTONE) 25 MG tablet TAKE 1 TABLET BY MOUTH EVERY DAY 90 tablet 0   No current facility-administered medications for this visit.    Allergies:   Sumatriptan and Ozempic (0.25 or 0.5 mg-dose) [semaglutide(0.25 or 0.5mg -dos)]    Social History:   reports that she has never smoked. She has never used smokeless tobacco. She reports current alcohol use. No history on file for drug use.   Family History:  family history includes Diabetes in her father, maternal grandmother, and paternal grandfather.    ROS:     Review of Systems  Constitutional: Negative.   HENT: Negative.    Eyes: Negative.   Respiratory: Negative.    Gastrointestinal: Negative.   Genitourinary: Negative.   Musculoskeletal: Negative.   Skin: Negative.   Neurological: Negative.   Endo/Heme/Allergies: Negative.   Psychiatric/Behavioral: Negative.    All other systems reviewed and are negative.     All other  systems are reviewed and negative.    PHYSICAL EXAM: VS:  BP 122/88   Pulse 77   Ht  (1.702 m)   Wt 221 lb 12.8 oz (100.6 kg)   SpO2 96%   BMI 34.74 kg/m  , BMI Body mass index is 34.74 kg/m. Last weight:  Wt Readings from Last 3 Encounters:  07/25/22 221 lb 12.8 oz (100.6 kg)  05/10/22 212 lb 15.4 oz (96.6 kg)  04/26/22 213 lb (96.6 kg)     Physical Exam Constitutional:      Appearance: Normal appearance.  Cardiovascular:     Rate and Rhythm: Normal rate and regular rhythm.     Heart sounds: Normal heart sounds.  Pulmonary:     Effort: Pulmonary effort is normal.     Breath sounds: Normal breath sounds.  Musculoskeletal:     Right lower leg: No edema.     Left lower leg: No edema.  Neurological:     Mental Status: She is alert.      EKG: none today  Recent Labs: 05/10/2022: ALT 23; BUN 11; Creatinine, Ser 0.85; Hemoglobin 14.9; Platelets 341; Potassium 3.6; Sodium 140    Lipid Panel No results found for: "CHOL", "TRIG", "HDL", "CHOLHDL", "VLDL", "LDLCALC", "LDLDIRECT"    Other studies Reviewed:  REASON FOR VISIT  Visit for: Echocardiogram/Chest pain unspecified  Sex:  female   wt=  239  lbs.  BP= 136/94  Height=  67  inches.        TESTS  Imaging: Echocardiogram:  An echocardiogram in (2-d) mode was performed and in Doppler mode with color flow velocity mapping was performed. The aortic valve cusps are abnormal 1.9  cm, flow velocity 1.3  m/s, and systolic calculated mean flow gradient 3  mmHg. Mitral valve diastolic peak flow velocity E 0.6   m/s and E/A ratio 1.1. Aortic root diameter 3.2   cm. The LVOT internal diameter 2  cm and flow velocity was abnormal 1  m/s. LV systolic dimension 2.7  cm, diastolic 4.1  cm, posterior wall thickness 1.1   cm, fractional shortening 32 %, and EF 65 %. IVS thickness 1  cm. LA dimension 2.8 cm  RIGHT atrium=  13  cm2. Mitral Valve =  Ea=10  DT= 169 msec. Mitral Valve is Normal. Aortic Valve is  Normal. Tricuspid Valve is Normal. Pulmonic Valve is Normal.     ASSESSMENT  Technically adequate study.  Ejection fraction-65  Left Ventricle- Normal size   Left Ventricle diastolic dysfunction grade-Grade 1 relaxation abnormality  Right Ventricle- Normal size and function  Normal right ventricular wall motion  Left Atrium-Normal size  Right Atrium-Normal size  Aortic valve-Trivial Aortic valve calcification with No stenosis, No regurgitation  Pulmonic Valve-No stenosis, no regurgitation  Mitral Valve-No regurgitation  Tricuspid Valve-No Regurgitation  No Pericardial effusion.     THERAPY   Referring physician: Joice Lofts Scoggins  Sonographer: Lenor Derrick.    Adrian Blackwater MD  Electronically signed by: Adrian Blackwater     Date: 11/15/2020 14:50 ASSESSMENT AND PLAN:    ICD-10-CM   1. Inappropriate sinus tachycardia  I47.11    take metoprolol at night    2. Palpitation  R00.2     3. Nonrheumatic mitral valve regurgitation  I34.0        Problem List Items Addressed This Visit   None Visit Diagnoses     Inappropriate sinus tachycardia    -  Primary   take metoprolol at night   Palpitation       Nonrheumatic mitral valve regurgitation            Disposition:   Return in about 3 months (around 10/24/2022).    Total time spent: 30 minutes  Signed,  Adrian Blackwater, MD  07/25/2022 9:28 AM    [facility]

## 2022-07-28 ENCOUNTER — Encounter: Payer: Self-pay | Admitting: Cardiovascular Disease

## 2022-09-12 ENCOUNTER — Other Ambulatory Visit: Payer: Self-pay | Admitting: Cardiovascular Disease

## 2022-09-12 DIAGNOSIS — I5189 Other ill-defined heart diseases: Secondary | ICD-10-CM

## 2022-10-24 ENCOUNTER — Ambulatory Visit (INDEPENDENT_AMBULATORY_CARE_PROVIDER_SITE_OTHER): Payer: BC Managed Care – PPO | Admitting: Cardiovascular Disease

## 2022-10-24 ENCOUNTER — Encounter: Payer: Self-pay | Admitting: Cardiovascular Disease

## 2022-10-24 VITALS — BP 109/80 | HR 82 | Ht 67.0 in | Wt 219.8 lb

## 2022-10-24 DIAGNOSIS — I340 Nonrheumatic mitral (valve) insufficiency: Secondary | ICD-10-CM | POA: Diagnosis not present

## 2022-10-24 DIAGNOSIS — I4711 Inappropriate sinus tachycardia, so stated: Secondary | ICD-10-CM

## 2022-10-24 DIAGNOSIS — R002 Palpitations: Secondary | ICD-10-CM

## 2022-10-24 NOTE — Progress Notes (Signed)
Cardiology Office Note   Date:  10/24/2022   ID:  Ailany Koren, DOB 04-Feb-1976, MRN [id number]  PCP:  Wilford Corner, PA-C  Cardiologist:  Adrian Blackwater, MD      History of Present Illness: Joanne Rowland is a 47 y.o. female who presents for  Chief Complaint  Patient presents with   Follow-up    3 mo F/U    Feeling much better, no palpitation      Past Medical History:  Diagnosis Date   Cancer (HCC)    Diabetes mellitus without complication (HCC)    Hashimoto's disease      Past Surgical History:  Procedure Laterality Date   ABDOMINAL HYSTERECTOMY     CHOLECYSTECTOMY       Current Outpatient Medications  Medication Sig Dispense Refill   MOUNJARO 2.5 MG/0.5ML Pen Inject 2.5 mg into the skin once a week.     levothyroxine (SYNTHROID) 125 MCG tablet Take 125 mcg by mouth daily before breakfast.     metoprolol succinate (TOPROL-XL) 100 MG 24 hr tablet TAKE 1 TABLET BY MOUTH EVERY DAY 90 tablet 1   nortriptyline (PAMELOR) 25 MG capsule Take 1 capsule by mouth at bedtime.     pantoprazole (PROTONIX) 40 MG tablet Take 40 mg by mouth daily.     spironolactone (ALDACTONE) 25 MG tablet TAKE 1 TABLET BY MOUTH EVERY DAY 90 tablet 0   No current facility-administered medications for this visit.    Allergies:   Sumatriptan and Ozempic (0.25 or 0.5 mg-dose) [semaglutide(0.25 or 0.5mg -dos)]    Social History:   reports that she has never smoked. She has never used smokeless tobacco. She reports current alcohol use. No history on file for drug use.   Family History:  family history includes Diabetes in her father, maternal grandmother, and paternal grandfather.    ROS:     Review of Systems  Constitutional: Negative.   HENT: Negative.    Eyes: Negative.   Respiratory: Negative.    Gastrointestinal: Negative.   Genitourinary: Negative.   Musculoskeletal: Negative.   Skin: Negative.   Neurological: Negative.   Endo/Heme/Allergies: Negative.    Psychiatric/Behavioral: Negative.    All other systems reviewed and are negative.     All other systems are reviewed and negative.    PHYSICAL EXAM: VS:  BP 109/80   Pulse 82   Ht 5\' 7"  (1.702 m)   Wt 219 lb 12.8 oz (99.7 kg)   SpO2 96%   BMI 34.43 kg/m  , BMI Body mass index is 34.43 kg/m. Last weight:  Wt Readings from Last 3 Encounters:  10/24/22 219 lb 12.8 oz (99.7 kg)  07/25/22 221 lb 12.8 oz (100.6 kg)  05/10/22 212 lb 15.4 oz (96.6 kg)     Physical Exam Constitutional:      Appearance: Normal appearance.  Cardiovascular:     Rate and Rhythm: Normal rate and regular rhythm.     Heart sounds: Normal heart sounds.  Pulmonary:     Effort: Pulmonary effort is normal.     Breath sounds: Normal breath sounds.  Musculoskeletal:     Right lower leg: No edema.     Left lower leg: No edema.  Neurological:     Mental Status: She is alert.       EKG:   Recent Labs: 05/10/2022: ALT 23; BUN 11; Creatinine, Ser 0.85; Hemoglobin 14.9; Platelets 341; Potassium 3.6; Sodium 140    Lipid Panel No results found for: "CHOL", "TRIG", "HDL", "  CHOLHDL", "VLDL", "LDLCALC", "LDLDIRECT"    Other studies Reviewed: Additional studies/ records that were reviewed today include:  Review of the above records demonstrates:       No data to display            ASSESSMENT AND PLAN:    ICD-10-CM   1. Inappropriate sinus tachycardia  I47.11    Doing well, continue current dosage metoprolol.    2. Palpitation  R00.2     3. Nonrheumatic mitral valve regurgitation  I34.0        Problem List Items Addressed This Visit   None Visit Diagnoses     Inappropriate sinus tachycardia    -  Primary   Doing well, continue current dosage metoprolol.   Palpitation       Nonrheumatic mitral valve regurgitation              Disposition:   Return in about 3 months (around 01/24/2023).    Total time spent: 30 minutes  Signed,  Adrian Blackwater, MD  10/24/2022 3:00 PM     [facility]

## 2022-10-28 ENCOUNTER — Other Ambulatory Visit: Payer: Self-pay | Admitting: Cardiovascular Disease

## 2022-12-10 ENCOUNTER — Other Ambulatory Visit: Payer: Self-pay | Admitting: Cardiovascular Disease

## 2022-12-10 DIAGNOSIS — I5189 Other ill-defined heart diseases: Secondary | ICD-10-CM

## 2023-01-23 ENCOUNTER — Encounter: Payer: Self-pay | Admitting: Cardiovascular Disease

## 2023-01-23 ENCOUNTER — Ambulatory Visit (INDEPENDENT_AMBULATORY_CARE_PROVIDER_SITE_OTHER): Payer: BC Managed Care – PPO | Admitting: Cardiovascular Disease

## 2023-01-23 VITALS — BP 100/80 | HR 85 | Ht 67.0 in | Wt 221.6 lb

## 2023-01-23 DIAGNOSIS — Z0130 Encounter for examination of blood pressure without abnormal findings: Secondary | ICD-10-CM | POA: Diagnosis not present

## 2023-01-23 DIAGNOSIS — I340 Nonrheumatic mitral (valve) insufficiency: Secondary | ICD-10-CM

## 2023-01-23 DIAGNOSIS — R002 Palpitations: Secondary | ICD-10-CM | POA: Diagnosis not present

## 2023-01-23 DIAGNOSIS — I4711 Inappropriate sinus tachycardia, so stated: Secondary | ICD-10-CM | POA: Diagnosis not present

## 2023-01-23 NOTE — Progress Notes (Signed)
Cardiology Office Note   Date:  01/23/2023   ID:  Joanne Rowland, DOB [birth year], MRN [id number]  PCP:  Wilford Corner, PA-C  Cardiologist:  Adrian Blackwater, MD      History of Present Illness: Joanne Rowland is a 47 y.o. female who presents for  Chief Complaint  Patient presents with   Follow-up    3 mo    Feels tired a lot      Past Medical History:  Diagnosis Date   Cancer (HCC)    Diabetes mellitus without complication (HCC)    Hashimoto's disease      Past Surgical History:  Procedure Laterality Date   ABDOMINAL HYSTERECTOMY     CHOLECYSTECTOMY       Current Outpatient Medications  Medication Sig Dispense Refill   levothyroxine (SYNTHROID) 125 MCG tablet Take 125 mcg by mouth daily before breakfast.     metoprolol succinate (TOPROL-XL) 100 MG 24 hr tablet TAKE 1 TABLET BY MOUTH EVERY DAY 90 tablet 1   MOUNJARO 2.5 MG/0.5ML Pen Inject 2.5 mg into the skin once a week.     nortriptyline (PAMELOR) 25 MG capsule Take 1 capsule by mouth at bedtime.     pantoprazole (PROTONIX) 40 MG tablet Take 40 mg by mouth daily.     spironolactone (ALDACTONE) 25 MG tablet TAKE 1 TABLET BY MOUTH EVERY DAY 90 tablet 0   No current facility-administered medications for this visit.    Allergies:   Sumatriptan and Ozempic (0.25 or 0.5 mg-dose) [semaglutide(0.25 or 0.5mg -dos)]    Social History:   reports that she has never smoked. She has never used smokeless tobacco. She reports current alcohol use. No history on file for drug use.   Family History:  family history includes Diabetes in her father, maternal grandmother, and paternal grandfather.    ROS:     Review of Systems  Constitutional: Negative.   HENT: Negative.    Eyes: Negative.   Respiratory: Negative.    Gastrointestinal: Negative.   Genitourinary: Negative.   Musculoskeletal: Negative.   Skin: Negative.   Neurological: Negative.   Endo/Heme/Allergies: Negative.   Psychiatric/Behavioral: Negative.     All other systems reviewed and are negative.     All other systems are reviewed and negative.    PHYSICAL EXAM: VS:  BP 100/80   Pulse 85   Ht 5\' 7"  (1.702 m)   Wt 221 lb 9.6 oz (100.5 kg)   SpO2 96%   BMI 34.71 kg/m  , BMI Body mass index is 34.71 kg/m. Last weight:  Wt Readings from Last 3 Encounters:  01/23/23 221 lb 9.6 oz (100.5 kg)  10/24/22 219 lb 12.8 oz (99.7 kg)  07/25/22 221 lb 12.8 oz (100.6 kg)     Physical Exam Constitutional:      Appearance: Normal appearance.  Cardiovascular:     Rate and Rhythm: Normal rate and regular rhythm.     Heart sounds: Normal heart sounds.  Pulmonary:     Effort: Pulmonary effort is normal.     Breath sounds: Normal breath sounds.  Musculoskeletal:     Right lower leg: No edema.     Left lower leg: No edema.  Neurological:     Mental Status: She is alert.       EKG:   Recent Labs: 05/10/2022: ALT 23; BUN 11; Creatinine, Ser 0.85; Hemoglobin 14.9; Platelets 341; Potassium 3.6; Sodium 140    Lipid Panel No results found for: "CHOL", "TRIG", "HDL", "CHOLHDL", "VLDL", "  LDLCALC", "LDLDIRECT"    Other studies Reviewed: Additional studies/ records that were reviewed today include:  Review of the above records demonstrates:       No data to display            ASSESSMENT AND PLAN:  No diagnosis found.   Problem List Items Addressed This Visit   None      Disposition:   Return in about 3 months (around 04/25/2023).    Total time spent: 35 minutes  Signed,  Adrian Blackwater, MD  01/23/2023 3:17 PM    [facility]

## 2023-02-23 ENCOUNTER — Inpatient Hospital Stay: Payer: BC Managed Care – PPO | Admitting: Oncology

## 2023-02-23 ENCOUNTER — Other Ambulatory Visit: Payer: BC Managed Care – PPO

## 2023-02-27 ENCOUNTER — Encounter: Payer: Self-pay | Admitting: Oncology

## 2023-02-27 ENCOUNTER — Inpatient Hospital Stay: Payer: BC Managed Care – PPO | Attending: Oncology | Admitting: Oncology

## 2023-02-27 ENCOUNTER — Inpatient Hospital Stay: Payer: BC Managed Care – PPO

## 2023-02-27 VITALS — BP 116/82 | HR 69 | Temp 96.0°F | Resp 18 | Wt 223.3 lb

## 2023-02-27 DIAGNOSIS — G4730 Sleep apnea, unspecified: Secondary | ICD-10-CM | POA: Insufficient documentation

## 2023-02-27 DIAGNOSIS — G4733 Obstructive sleep apnea (adult) (pediatric): Secondary | ICD-10-CM | POA: Diagnosis not present

## 2023-02-27 DIAGNOSIS — D751 Secondary polycythemia: Secondary | ICD-10-CM | POA: Insufficient documentation

## 2023-02-27 DIAGNOSIS — Z8349 Family history of other endocrine, nutritional and metabolic diseases: Secondary | ICD-10-CM

## 2023-02-27 DIAGNOSIS — Z79899 Other long term (current) drug therapy: Secondary | ICD-10-CM | POA: Insufficient documentation

## 2023-02-27 LAB — IRON AND TIBC
Iron: 68 ug/dL (ref 28–170)
Saturation Ratios: 18 % (ref 10.4–31.8)
TIBC: 370 ug/dL (ref 250–450)
UIBC: 302 ug/dL

## 2023-02-27 LAB — CBC WITH DIFFERENTIAL/PLATELET
Abs Immature Granulocytes: 0 10*3/uL (ref 0.00–0.07)
Band Neutrophils: 0 %
Basophils Absolute: 0 10*3/uL (ref 0.0–0.1)
Basophils Relative: 0 %
Blasts: 0 %
Eosinophils Absolute: 0.1 10*3/uL (ref 0.0–0.5)
Eosinophils Relative: 2 %
HCT: 42.1 % (ref 36.0–46.0)
Hemoglobin: 14.3 g/dL (ref 12.0–15.0)
Immature Granulocytes: 0 %
Lymphocytes Relative: 30 %
Lymphs Abs: 2.1 10*3/uL (ref 0.7–4.0)
MCH: 31.3 pg (ref 26.0–34.0)
MCHC: 34 g/dL (ref 30.0–36.0)
MCV: 92.1 fL (ref 80.0–100.0)
Metamyelocytes Relative: 0 %
Monocytes Absolute: 0.4 10*3/uL (ref 0.1–1.0)
Monocytes Relative: 5 %
Myelocytes: 0 %
Neutro Abs: 4.5 10*3/uL (ref 1.7–7.7)
Neutrophils Relative %: 63 %
Other: 0 %
Platelets: 302 10*3/uL (ref 150–400)
Promyelocytes Relative: 0 %
RBC: 4.57 MIL/uL (ref 3.87–5.11)
RDW: 12.5 % (ref 11.5–15.5)
WBC: 7.1 10*3/uL (ref 4.0–10.5)
nRBC: 0 % (ref 0.0–0.2)
nRBC: 0 /100{WBCs}

## 2023-02-27 LAB — TECHNOLOGIST SMEAR REVIEW: Plt Morphology: ADEQUATE

## 2023-02-27 LAB — FERRITIN: Ferritin: 42 ng/mL (ref 11–307)

## 2023-02-27 NOTE — Assessment & Plan Note (Signed)
Labs are reviewed and discussed with patient.  Erythrocytosis is an abnormal elevation of hemoglobin and/or hematocrit in peripheral blood, and this can be caused by primary etiology, ie myeloproliferative disease, or secondary etiology, ie sleep apnea etc  or familiar condition. She does not smoke Likely erythrocytosis is due to sleep apnea, encourage patient to use CPAP I will check erythropoietin, carbo monoxide level, JAK2 with reflex to other mutations, BCR-ABL1 FISH.

## 2023-02-27 NOTE — Progress Notes (Signed)
Hematology/Oncology Consult note [phone] [phone]        REFERRING PROVIDER: Debbra Riding Hestle,*   CHIEF COMPLAINTS/REASON FOR VISIT:  Evaluation of erythrocytosis   ASSESSMENT & PLAN:   Family history of hemochromatosis Check hemochromatosis DNA screening, iron panel  Erythrocytosis Labs are reviewed and discussed with patient.  Erythrocytosis is an abnormal elevation of hemoglobin and/or hematocrit in peripheral blood, and this can be caused by primary etiology, ie myeloproliferative disease, or secondary etiology, ie sleep apnea etc  or familiar condition. She does not smoke Likely erythrocytosis is due to sleep apnea, encourage patient to use CPAP I will check erythropoietin, carbo monoxide level, JAK2 with reflex to other mutations, BCR-ABL1 FISH.      Orders Placed This Encounter  Procedures   CBC with Differential/Platelet    Standing Status:   Future    Number of Occurrences:   1    Standing Expiration Date:   02/27/2024   BCR-ABL1 FISH    Standing Status:   Future    Number of Occurrences:   1    Standing Expiration Date:   02/27/2024   Carbon monoxide, blood (performed at ref lab)    Standing Status:   Future    Number of Occurrences:   1    Standing Expiration Date:   02/27/2024   Erythropoietin    Standing Status:   Future    Number of Occurrences:   1    Standing Expiration Date:   02/27/2024   JAK2 V617F rfx CALR/MPL/E12-15    Standing Status:   Future    Number of Occurrences:   1    Standing Expiration Date:   02/27/2024   Technologist smear review    Standing Status:   Future    Number of Occurrences:   1    Standing Expiration Date:   02/27/2024    Order Specific Question:   Clinical information:    Answer:   erythrocytosis   Ferritin    Standing Status:   Future    Number of Occurrences:   1    Standing Expiration Date:   08/27/2023   Iron and TIBC    Standing Status:   Future    Number of Occurrences:   1     Standing Expiration Date:   02/27/2024   Hemochromatosis DNA-PCR(c282y,h63d)    Standing Status:   Future    Number of Occurrences:   1    Standing Expiration Date:   02/27/2024   CBC with Differential (Cancer Center Only)    Standing Status:   Future    Standing Expiration Date:   02/27/2024   Follow up in 6 months.  All questions were answered. The patient knows to call the clinic with any problems, questions or concerns.  Rickard Patience, MD, PhD [facility] Hematology Oncology 02/27/2023   HISTORY OF PRESENTING ILLNESS:   Joanne Rowland is a  47 y.o.  female with PMH listed below was seen in consultation at the request of  Wilford Corner,*  for evaluation of elevated hemoglobin.   She was found to have abnormal CBC on 02/19/23 with Hb 17.4, Hct 48.5, wbc 10.3,  Reviewed patient's previous labs.  elevated hemoglobin is chronic onset, intermittent, dated back to 2021 The patient denies smoking.  The patient has been diagnosed with sleep apnea and was offered a CPAP machine, but found it intolerable. This was attempted last year and despite several months of trying, the patient was unable to  tolerate and frequently removed it during the night.   She also has a history of low vitamin B12 levels and has recently started receiving B12 shots. The patient follows a predominantly vegetarian diet due to an aversion to cooking meat. She consumes meat only when dining out or when it is prepared by her spouse. The patient rarely consumes alcohol due to intolerance.  She reports a family history of hereditary hemochromatosis in her mother, The patient has not been formally screened for this condition     MEDICAL HISTORY:  Past Medical History:  Diagnosis Date   Cancer (HCC)    Diabetes mellitus without complication (HCC)    Hashimoto's disease     SURGICAL HISTORY: Past Surgical History:  Procedure Laterality Date   ABDOMINAL HYSTERECTOMY     CHOLECYSTECTOMY      SOCIAL  HISTORY: Social History   Socioeconomic History   Marital status: Married    Spouse name: Not on file   Number of children: Not on file   Years of education: Not on file   Highest education level: Not on file  Occupational History   Not on file  Tobacco Use   Smoking status: Never   Smokeless tobacco: Never  Vaping Use   Vaping status: Never Used  Substance and Sexual Activity   Alcohol use: Yes    Comment: occasional    Drug use: Not on file   Sexual activity: Not on file  Other Topics Concern   Not on file  Social History Narrative   Not on file   Social Determinants of Health   Financial Resource Strain: Low Risk  (02/27/2023)   Overall Financial Resource Strain (CARDIA)    Difficulty of Paying Living Expenses: Not very hard  Food Insecurity: No Food Insecurity (02/27/2023)   Hunger Vital Sign    Worried About Running Out of Food in the Last Year: Never true    Ran Out of Food in the Last Year: Never true  Transportation Needs: No Transportation Needs (02/27/2023)   PRAPARE - Administrator, Civil Service (Medical): No    Lack of Transportation (Non-Medical): No  Physical Activity: Not on file  Stress: Not on file  Social Connections: Not on file  Intimate Partner Violence: Not At Risk (02/27/2023)   Humiliation, Afraid, Rape, and Kick questionnaire    Fear of Current or Ex-Partner: No    Emotionally Abused: No    Physically Abused: No    Sexually Abused: No    FAMILY HISTORY: Family History  Problem Relation Age of Onset   Hemochromatosis Mother    Diabetes Father    Diabetes Maternal Grandmother    Diabetes Paternal Grandfather     ALLERGIES:  is allergic to sumatriptan and ozempic (0.25 or 0.5 mg-dose) [semaglutide(0.25 or 0.5mg -dos)].  MEDICATIONS:  Current Outpatient Medications  Medication Sig Dispense Refill   [START ON 03/26/2023] cyanocobalamin (VITAMIN B12) 1000 MCG/ML injection Inject into the muscle.     levothyroxine  (SYNTHROID) 125 MCG tablet Take 125 mcg by mouth daily before breakfast.     metoprolol succinate (TOPROL-XL) 100 MG 24 hr tablet TAKE 1 TABLET BY MOUTH EVERY DAY 90 tablet 1   MOUNJARO 2.5 MG/0.5ML Pen Inject 2.5 mg into the skin once a week.     nortriptyline (PAMELOR) 25 MG capsule Take 1 capsule by mouth at bedtime.     pantoprazole (PROTONIX) 40 MG tablet Take 40 mg by mouth daily.     spironolactone (ALDACTONE) 25  MG tablet TAKE 1 TABLET BY MOUTH EVERY DAY 90 tablet 0   No current facility-administered medications for this visit.    Review of Systems  Constitutional:  Negative for appetite change, chills, fatigue and fever.  HENT:   Negative for hearing loss and voice change.   Eyes:  Negative for eye problems.  Respiratory:  Negative for chest tightness and cough.   Cardiovascular:  Negative for chest pain.  Gastrointestinal:  Negative for abdominal distention, abdominal pain and blood in stool.  Endocrine: Negative for hot flashes.  Genitourinary:  Negative for difficulty urinating and frequency.   Musculoskeletal:  Positive for back pain. Negative for arthralgias.  Skin:  Negative for itching and rash.  Neurological:  Negative for extremity weakness.  Hematological:  Negative for adenopathy.  Psychiatric/Behavioral:  Negative for confusion.     PHYSICAL EXAMINATION: ECOG PERFORMANCE STATUS: 0 - Asymptomatic Vitals:   02/27/23 0922  BP: 116/82  Pulse: 69  Resp: 18  Temp: (!) 96 F (35.6 C)  SpO2: 99%   Filed Weights   02/27/23 0922  Weight: 223 lb 4.8 oz (101.3 kg)   Physical Exam Constitutional:      General: She is not in acute distress. HENT:     Head: Normocephalic and atraumatic.  Eyes:     General: No scleral icterus. Cardiovascular:     Rate and Rhythm: Normal rate and regular rhythm.  Pulmonary:     Effort: Pulmonary effort is normal. No respiratory distress.     Breath sounds: No wheezing.  Abdominal:     General: Bowel sounds are normal. There  is no distension.     Palpations: Abdomen is soft.  Musculoskeletal:        General: Normal range of motion.     Cervical back: Normal range of motion and neck supple.  Skin:    General: Skin is warm and dry.     Findings: No erythema or rash.  Neurological:     Mental Status: She is alert and oriented to person, place, and time. Mental status is at baseline.  Psychiatric:        Mood and Affect: Mood normal.     LABORATORY DATA:  I have reviewed the data as listed    Latest Ref Rng & Units 02/27/2023   10:12 AM 05/10/2022    4:51 AM 04/26/2022    3:55 PM  CBC  WBC 4.0 - 10.5 K/uL 7.1  12.7  12.9   Hemoglobin 12.0 - 15.0 g/dL 16.1  09.6  04.5   Hematocrit 36.0 - 46.0 % 42.1  44.3  51.1   Platelets 150 - 400 K/uL 302  341  440       Latest Ref Rng & Units 05/10/2022    4:51 AM 04/26/2022    3:55 PM 11/05/2020   12:33 PM  CMP  Glucose 70 - 99 mg/dL 409  94  811   BUN 6 - 20 mg/dL 11  20  13    Creatinine 0.44 - 1.00 mg/dL 9.14  7.82  9.56   Sodium 135 - 145 mmol/L 140  138  138   Potassium 3.5 - 5.1 mmol/L 3.6  3.9  3.6   Chloride 98 - 111 mmol/L 103  99  100   CO2 22 - 32 mmol/L 29  30  29    Calcium 8.9 - 10.3 mg/dL 9.5  9.6  9.0   Total Protein 6.5 - 8.1 g/dL 8.0  8.4    Total Bilirubin  0.3 - 1.2 mg/dL 0.8  0.7    Alkaline Phos 38 - 126 U/L 59  71    AST 15 - 41 U/L 24  15    ALT 0 - 44 U/L 23  18        RADIOGRAPHIC STUDIES: I have personally reviewed the radiological images as listed and agreed with the findings in the report. No results found.

## 2023-02-27 NOTE — Assessment & Plan Note (Signed)
Check hemochromatosis DNA screening, iron panel

## 2023-03-01 LAB — ERYTHROPOIETIN: Erythropoietin: 17.5 m[IU]/mL (ref 2.6–18.5)

## 2023-03-02 LAB — CARBON MONOXIDE, BLOOD (PERFORMED AT REF LAB): Carbon Monoxide, Blood: 2.2 % (ref 0.0–3.6)

## 2023-03-04 LAB — BCR-ABL1 FISH
Cells Analyzed: 200
Cells Counted: 200

## 2023-03-06 LAB — HEMOCHROMATOSIS DNA-PCR(C282Y,H63D)

## 2023-03-08 ENCOUNTER — Other Ambulatory Visit: Payer: Self-pay | Admitting: Cardiovascular Disease

## 2023-03-08 DIAGNOSIS — I5189 Other ill-defined heart diseases: Secondary | ICD-10-CM

## 2023-03-09 LAB — CALR +MPL + E12-E15  (REFLEX)

## 2023-03-09 LAB — JAK2 V617F RFX CALR/MPL/E12-15

## 2023-04-21 ENCOUNTER — Telehealth: Payer: Self-pay | Admitting: Oncology

## 2023-04-21 NOTE — Telephone Encounter (Signed)
 Patient came and saw Dr. Cathie Hoops in December and was wondering if her labs were okay. I told her I would let the team know. She wanted to speak to someone clinical to go over her labs.

## 2023-04-22 NOTE — Telephone Encounter (Signed)
 Spoke to pt and informed him of MD response

## 2023-04-24 ENCOUNTER — Encounter: Payer: Self-pay | Admitting: Cardiovascular Disease

## 2023-04-24 ENCOUNTER — Ambulatory Visit (INDEPENDENT_AMBULATORY_CARE_PROVIDER_SITE_OTHER): Payer: BC Managed Care – PPO | Admitting: Cardiovascular Disease

## 2023-04-24 VITALS — BP 122/72 | HR 95 | Ht 67.0 in | Wt 221.6 lb

## 2023-04-24 DIAGNOSIS — I4711 Inappropriate sinus tachycardia, so stated: Secondary | ICD-10-CM

## 2023-04-24 DIAGNOSIS — I340 Nonrheumatic mitral (valve) insufficiency: Secondary | ICD-10-CM

## 2023-04-24 DIAGNOSIS — Z0130 Encounter for examination of blood pressure without abnormal findings: Secondary | ICD-10-CM

## 2023-04-24 DIAGNOSIS — R0789 Other chest pain: Secondary | ICD-10-CM | POA: Diagnosis not present

## 2023-04-24 DIAGNOSIS — R002 Palpitations: Secondary | ICD-10-CM

## 2023-04-24 NOTE — Progress Notes (Signed)
Cardiology Office Note   Date:  04/24/2023   ID:  Minakshi Nordine, DOB 07-Apr-1976, MRN [id number]  PCP:  Wilford Corner, PA-C  Cardiologist:  Adrian Blackwater, MD      History of Present Illness: Joanne Rowland is a 48 y.o. female who presents for  Chief Complaint  Patient presents with   Follow-up    3 monght of pu    Had chest couple of weeks ago and blurring of vision  Chest Pain  This is a new problem. The current episode started 1 to 4 weeks ago. The onset quality is sudden. The pain is present in the substernal region. The pain is at a severity of 4/10. The quality of the pain is described as sharp and tightness. Associated symptoms include headaches, near-syncope and shortness of breath. Pertinent negatives include no dizziness.      Past Medical History:  Diagnosis Date   Cancer (HCC)    Diabetes mellitus without complication (HCC)    Hashimoto's disease      Past Surgical History:  Procedure Laterality Date   ABDOMINAL HYSTERECTOMY     CHOLECYSTECTOMY       Current Outpatient Medications  Medication Sig Dispense Refill   celecoxib (CELEBREX) 200 MG capsule Take 200 mg by mouth daily.     cyanocobalamin (VITAMIN B12) 1000 MCG/ML injection Inject into the muscle.     levothyroxine (SYNTHROID) 125 MCG tablet Take 125 mcg by mouth daily before breakfast.     metoprolol succinate (TOPROL-XL) 100 MG 24 hr tablet TAKE 1 TABLET BY MOUTH EVERY DAY 90 tablet 1   MOUNJARO 2.5 MG/0.5ML Pen Inject 2.5 mg into the skin once a week.     nortriptyline (PAMELOR) 25 MG capsule Take 1 capsule by mouth at bedtime.     pantoprazole (PROTONIX) 40 MG tablet Take 40 mg by mouth daily.     spironolactone (ALDACTONE) 25 MG tablet TAKE 1 TABLET BY MOUTH EVERY DAY 90 tablet 0   No current facility-administered medications for this visit.    Allergies:   Sumatriptan and Ozempic (0.25 or 0.5 mg-dose) [semaglutide(0.25 or 0.5mg -dos)]    Social History:   reports that she has  never smoked. She has never used smokeless tobacco. She reports current alcohol use. No history on file for drug use.   Family History:  family history includes Diabetes in her father, maternal grandmother, and paternal grandfather; Hemochromatosis in her mother.    ROS:     Review of Systems  Constitutional: Negative.   HENT: Negative.    Eyes: Negative.   Respiratory:  Positive for shortness of breath.   Cardiovascular:  Positive for chest pain and near-syncope.  Gastrointestinal: Negative.   Genitourinary: Negative.   Musculoskeletal: Negative.   Skin: Negative.   Neurological:  Positive for headaches. Negative for dizziness.  Endo/Heme/Allergies: Negative.   Psychiatric/Behavioral: Negative.    All other systems reviewed and are negative.     All other systems are reviewed and negative.    PHYSICAL EXAM: VS:  BP 122/72   Pulse 95   Ht 5\' 7"  (1.702 m)   Wt 221 lb 9.6 oz (100.5 kg)   SpO2 95%   BMI 34.71 kg/m  , BMI Body mass index is 34.71 kg/m. Last weight:  Wt Readings from Last 3 Encounters:  04/24/23 221 lb 9.6 oz (100.5 kg)  02/27/23 223 lb 4.8 oz (101.3 kg)  01/23/23 221 lb 9.6 oz (100.5 kg)     Physical Exam Constitutional:  Appearance: Normal appearance.  Cardiovascular:     Rate and Rhythm: Normal rate and regular rhythm.     Heart sounds: Normal heart sounds.  Pulmonary:     Effort: Pulmonary effort is normal.     Breath sounds: Normal breath sounds.  Musculoskeletal:     Right lower leg: No edema.     Left lower leg: No edema.  Neurological:     Mental Status: She is alert.       EKG:   Recent Labs: 05/10/2022: ALT 23; BUN 11; Creatinine, Ser 0.85; Potassium 3.6; Sodium 140 02/27/2023: Hemoglobin 14.3; Platelets 302    Lipid Panel No results found for: "CHOL", "TRIG", "HDL", "CHOLHDL", "VLDL", "LDLCALC", "LDLDIRECT"    Other studies Reviewed: Additional studies/ records that were reviewed today include:  Review of the above  records demonstrates:       No data to display            ASSESSMENT AND PLAN:    ICD-10-CM   1. Inappropriate sinus tachycardia (HCC)  I47.11 PCV ECHOCARDIOGRAM COMPLETE    MYOCARDIAL PERFUSION IMAGING    2. Palpitation  R00.2 PCV ECHOCARDIOGRAM COMPLETE    MYOCARDIAL PERFUSION IMAGING    3. Nonrheumatic mitral valve regurgitation  I34.0 PCV ECHOCARDIOGRAM COMPLETE    MYOCARDIAL PERFUSION IMAGING    4. Other chest pain  R07.89 PCV ECHOCARDIOGRAM COMPLETE    MYOCARDIAL PERFUSION IMAGING   Advise echo, stress test       Problem List Items Addressed This Visit   None Visit Diagnoses       Inappropriate sinus tachycardia (HCC)    -  Primary   Relevant Orders   PCV ECHOCARDIOGRAM COMPLETE   MYOCARDIAL PERFUSION IMAGING     Palpitation       Relevant Orders   PCV ECHOCARDIOGRAM COMPLETE   MYOCARDIAL PERFUSION IMAGING     Nonrheumatic mitral valve regurgitation       Relevant Orders   PCV ECHOCARDIOGRAM COMPLETE   MYOCARDIAL PERFUSION IMAGING     Other chest pain       Advise echo, stress test   Relevant Orders   PCV ECHOCARDIOGRAM COMPLETE   MYOCARDIAL PERFUSION IMAGING          Disposition:   Return in about 2 weeks (around 05/08/2023) for echo, stress test and f/u.    Total time spent: 30 minutes  Signed,  Adrian Blackwater, MD  04/24/2023 1:38 PM    [facility]

## 2023-05-03 IMAGING — CR DG CHEST 2V
1 series · 2 of 2 positions shown · non-contrast
Comparison: Report from prior chest radiographs 04/10/2020 (images
unavailable).

CLINICAL DATA: Hypertension.

EXAM:
CHEST - 2 VIEW

[Series 1: dg chest 2 view · 0.14mm/px · 2 of 2 slices shown]
[im 1/2]
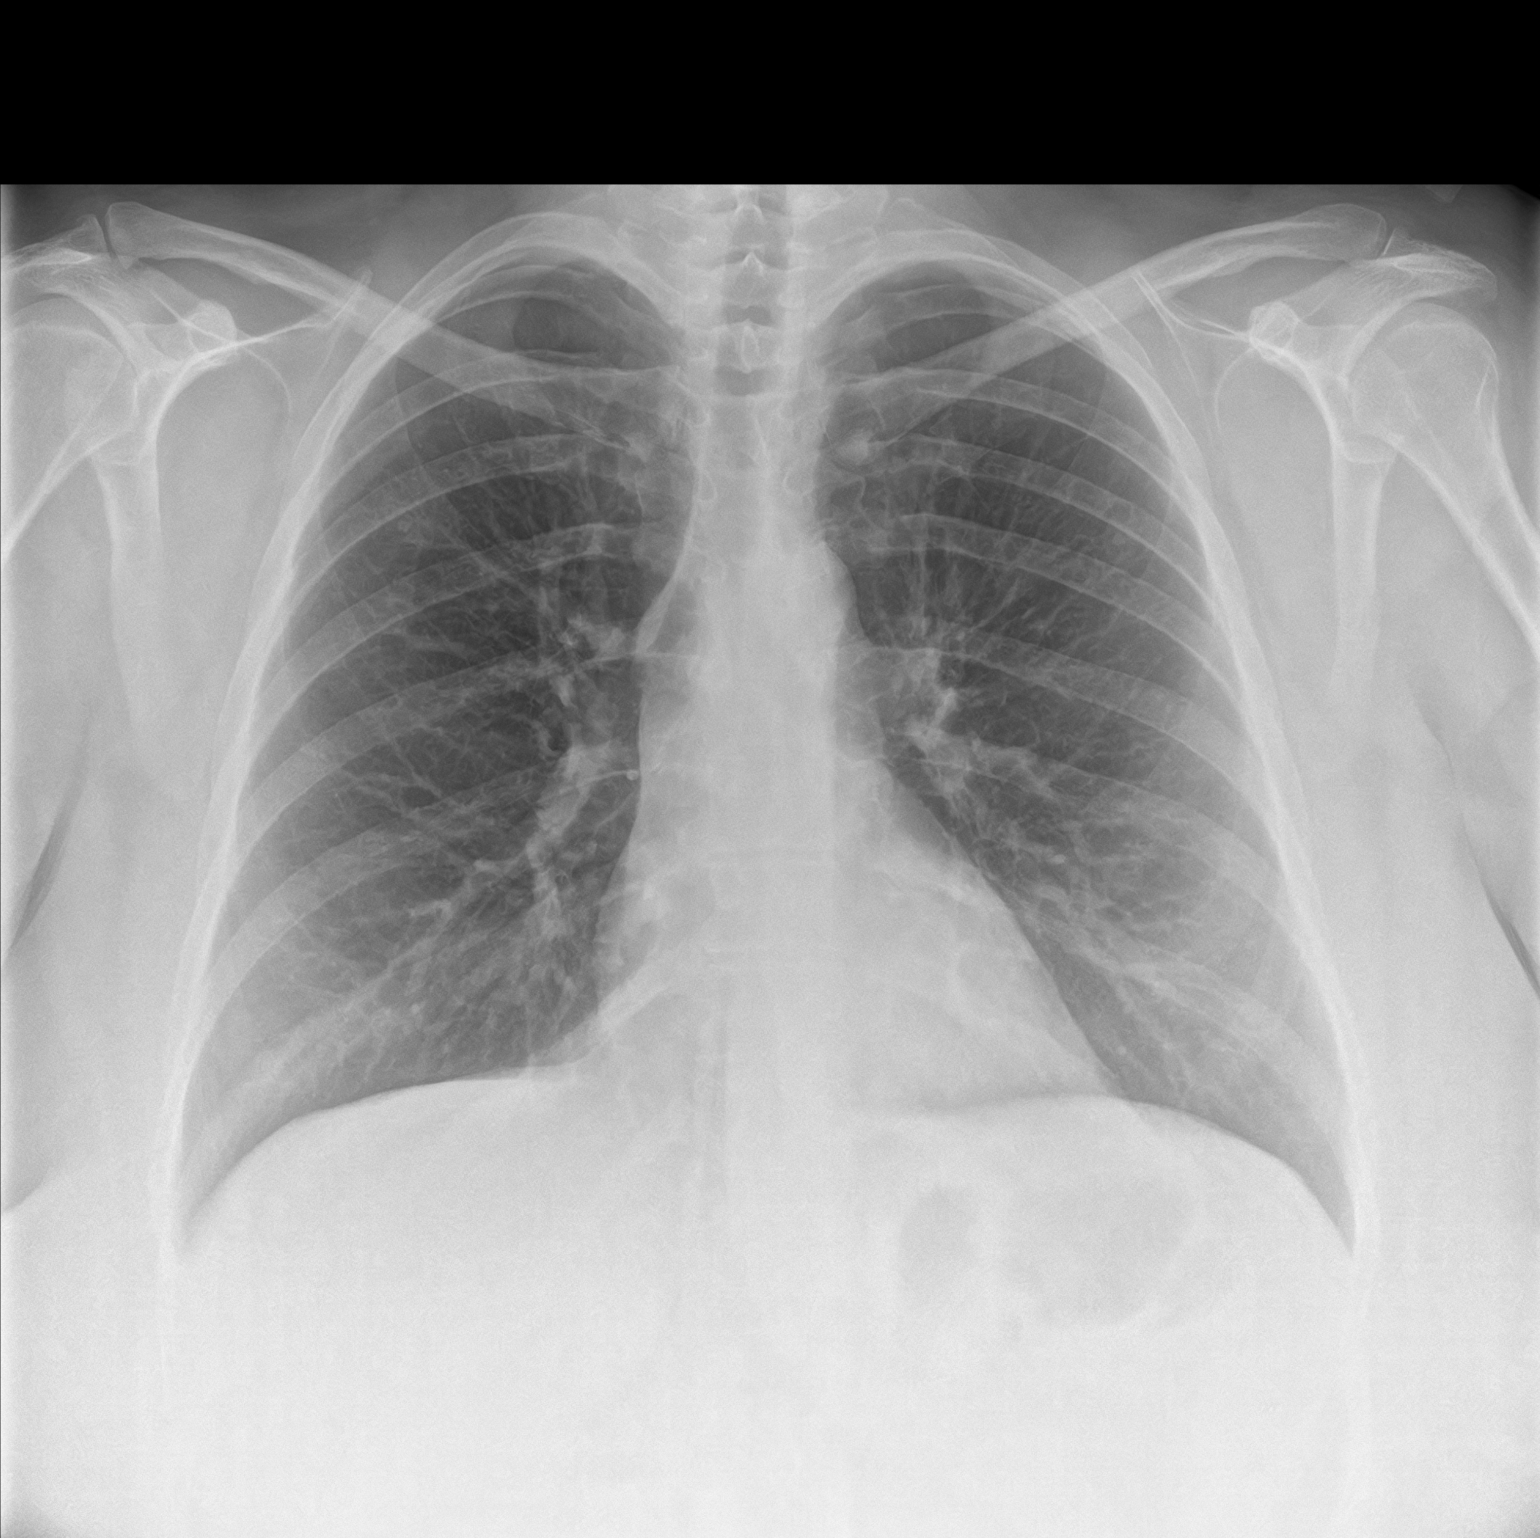
[im 2/2]
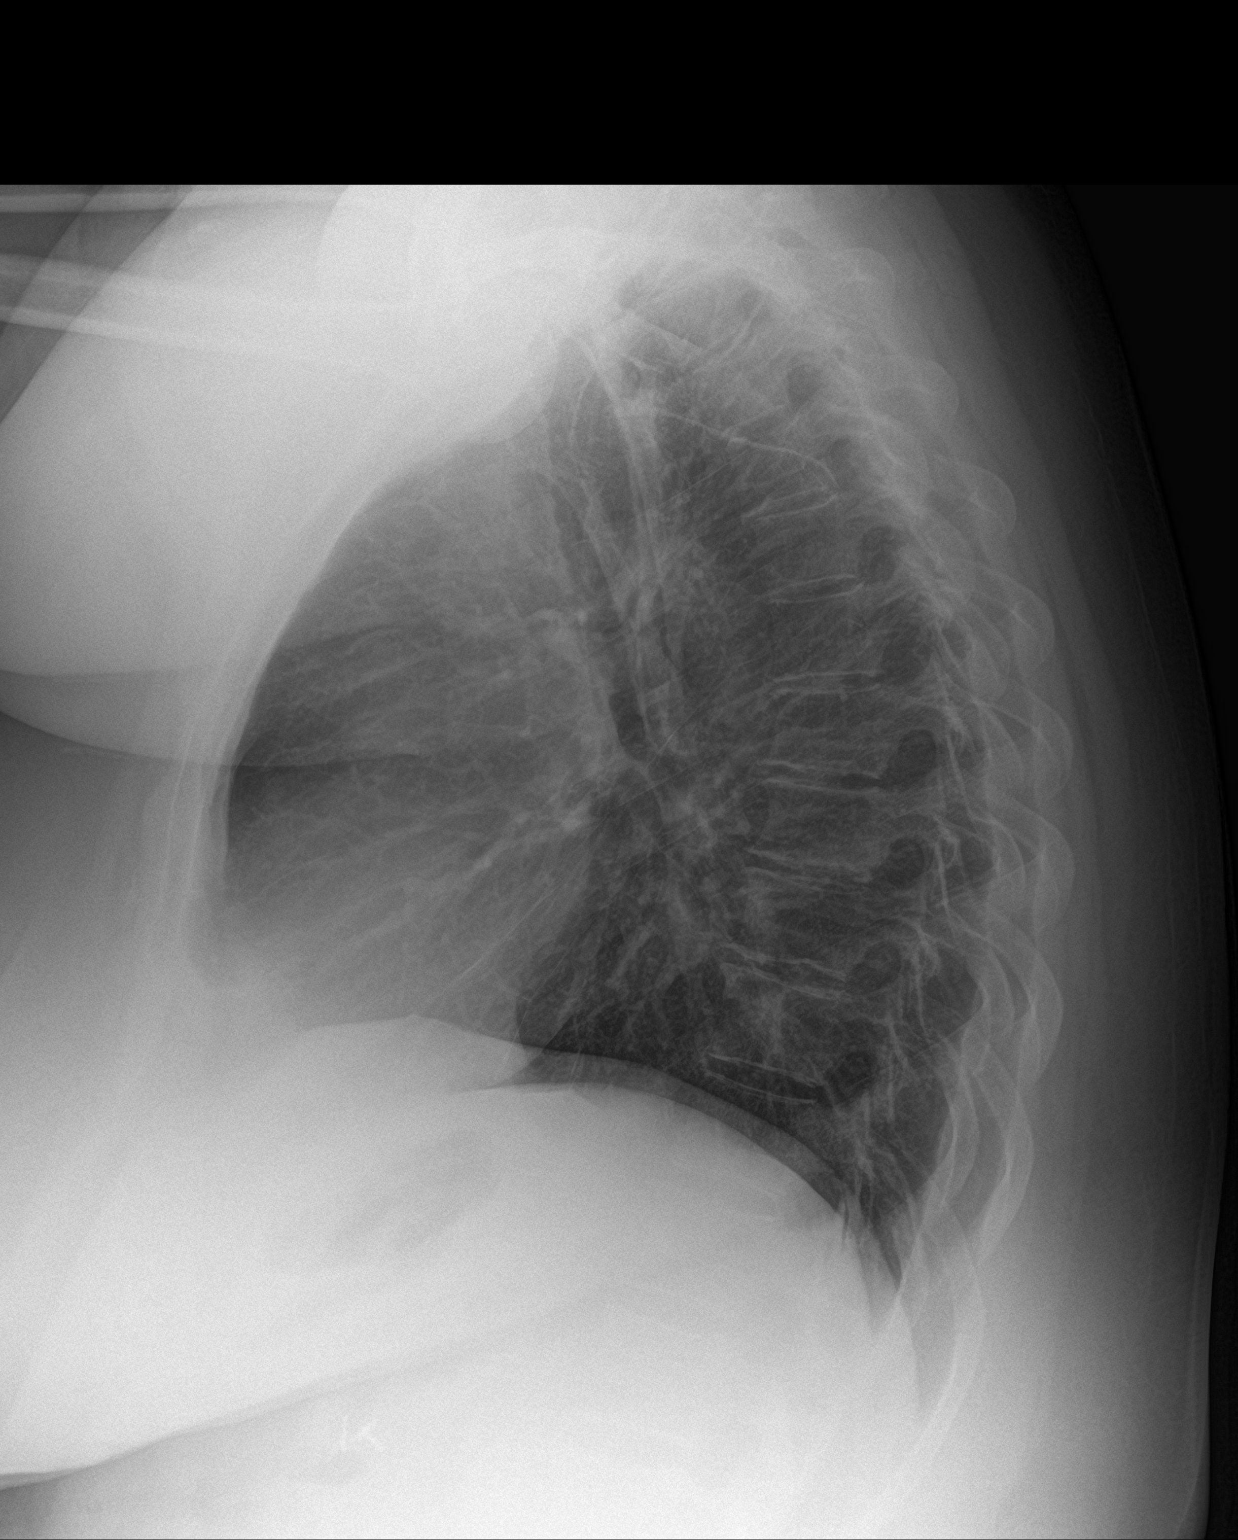

[2 of 2 positions shown; findings below may reference images not displayed]

FINDINGS: Heart size within normal limits. No appreciable airspace
consolidation. No evidence of pleural effusion or pneumothorax. No
acute bony abnormality identified.
IMPRESSION: No evidence of active cardiopulmonary disease.

## 2023-05-07 ENCOUNTER — Ambulatory Visit (INDEPENDENT_AMBULATORY_CARE_PROVIDER_SITE_OTHER): Payer: BC Managed Care – PPO

## 2023-05-07 DIAGNOSIS — R002 Palpitations: Secondary | ICD-10-CM

## 2023-05-07 DIAGNOSIS — I340 Nonrheumatic mitral (valve) insufficiency: Secondary | ICD-10-CM | POA: Diagnosis not present

## 2023-05-07 DIAGNOSIS — R0789 Other chest pain: Secondary | ICD-10-CM | POA: Diagnosis not present

## 2023-05-07 DIAGNOSIS — I4711 Inappropriate sinus tachycardia, so stated: Secondary | ICD-10-CM

## 2023-05-07 MED ORDER — TECHNETIUM TC 99M SESTAMIBI GENERIC - CARDIOLITE
31.0000 | Freq: Once | INTRAVENOUS | Status: AC | PRN
  Administered 2023-05-07: 31 via INTRAVENOUS

## 2023-05-07 MED ORDER — TECHNETIUM TC 99M SESTAMIBI GENERIC - CARDIOLITE
9.9000 | Freq: Once | INTRAVENOUS | Status: AC | PRN
  Administered 2023-05-07: 9.9 via INTRAVENOUS

## 2023-05-22 ENCOUNTER — Ambulatory Visit: Payer: BC Managed Care – PPO

## 2023-05-25 ENCOUNTER — Ambulatory Visit: Payer: BC Managed Care – PPO | Admitting: Cardiovascular Disease

## 2023-05-29 ENCOUNTER — Ambulatory Visit: Payer: BC Managed Care – PPO | Admitting: Cardiovascular Disease

## 2023-06-26 ENCOUNTER — Other Ambulatory Visit: Payer: BC Managed Care – PPO

## 2023-06-26 ENCOUNTER — Ambulatory Visit: Payer: BC Managed Care – PPO

## 2023-06-26 DIAGNOSIS — I340 Nonrheumatic mitral (valve) insufficiency: Secondary | ICD-10-CM | POA: Diagnosis not present

## 2023-06-26 DIAGNOSIS — I361 Nonrheumatic tricuspid (valve) insufficiency: Secondary | ICD-10-CM

## 2023-07-03 ENCOUNTER — Ambulatory Visit (INDEPENDENT_AMBULATORY_CARE_PROVIDER_SITE_OTHER): Payer: BC Managed Care – PPO | Admitting: Cardiovascular Disease

## 2023-07-03 ENCOUNTER — Encounter: Payer: Self-pay | Admitting: Cardiovascular Disease

## 2023-07-03 VITALS — BP 123/72 | HR 75 | Ht 67.0 in | Wt 224.0 lb

## 2023-07-03 DIAGNOSIS — R0789 Other chest pain: Secondary | ICD-10-CM | POA: Diagnosis not present

## 2023-07-03 DIAGNOSIS — R002 Palpitations: Secondary | ICD-10-CM | POA: Diagnosis not present

## 2023-07-03 DIAGNOSIS — I4711 Inappropriate sinus tachycardia, so stated: Secondary | ICD-10-CM

## 2023-07-03 DIAGNOSIS — I340 Nonrheumatic mitral (valve) insufficiency: Secondary | ICD-10-CM | POA: Diagnosis not present

## 2023-07-03 NOTE — Progress Notes (Signed)
 Cardiology Office Note   Date:  07/03/2023   ID:  Joanne Rowland, DOB November 05, 1975, MRN [id number]  PCP:  Wilford Corner, PA-C  Cardiologist:  Adrian Blackwater, MD      History of Present Illness: Joanne Rowland is a 48 y.o. female who presents for  Chief Complaint  Patient presents with   Follow-up    Results ECHO/NST    No chest pain or SOB      Past Medical History:  Diagnosis Date   Cancer (HCC)    Diabetes mellitus without complication (HCC)    Hashimoto's disease      Past Surgical History:  Procedure Laterality Date   ABDOMINAL HYSTERECTOMY     CHOLECYSTECTOMY       Current Outpatient Medications  Medication Sig Dispense Refill   cyanocobalamin (VITAMIN B12) 1000 MCG/ML injection Inject into the muscle.     levothyroxine (SYNTHROID) 125 MCG tablet Take 125 mcg by mouth daily before breakfast.     metoprolol succinate (TOPROL-XL) 100 MG 24 hr tablet TAKE 1 TABLET BY MOUTH EVERY DAY 90 tablet 1   MOUNJARO 2.5 MG/0.5ML Pen Inject 2.5 mg into the skin once a week.     nortriptyline (PAMELOR) 25 MG capsule Take 1 capsule by mouth at bedtime.     pantoprazole (PROTONIX) 40 MG tablet Take 40 mg by mouth daily.     spironolactone (ALDACTONE) 25 MG tablet TAKE 1 TABLET BY MOUTH EVERY DAY 90 tablet 0   UBRELVY 50 MG TABS Take 1 tablet by mouth daily as needed.     No current facility-administered medications for this visit.    Allergies:   Sumatriptan and Ozempic (0.25 or 0.5 mg-dose) [semaglutide(0.25 or 0.5mg -dos)]    Social History:   reports that she has never smoked. She has never used smokeless tobacco. She reports current alcohol use. No history on file for drug use.   Family History:  family history includes Diabetes in her father, maternal grandmother, and paternal grandfather; Hemochromatosis in her mother.    ROS:     Review of Systems  Constitutional: Negative.   HENT: Negative.    Eyes: Negative.   Respiratory: Negative.     Gastrointestinal: Negative.   Genitourinary: Negative.   Musculoskeletal: Negative.   Skin: Negative.   Neurological: Negative.   Endo/Heme/Allergies: Negative.   Psychiatric/Behavioral: Negative.    All other systems reviewed and are negative.     All other systems are reviewed and negative.    PHYSICAL EXAM: VS:  BP 123/72   Pulse 75   Ht 5\' 7"  (1.702 m)   Wt 224 lb (101.6 kg)   SpO2 98%   BMI 35.08 kg/m  , BMI Body mass index is 35.08 kg/m. Last weight:  Wt Readings from Last 3 Encounters:  07/03/23 224 lb (101.6 kg)  04/24/23 221 lb 9.6 oz (100.5 kg)  02/27/23 223 lb 4.8 oz (101.3 kg)     Physical Exam Constitutional:      Appearance: Normal appearance.  Cardiovascular:     Rate and Rhythm: Normal rate and regular rhythm.     Heart sounds: Normal heart sounds.  Pulmonary:     Effort: Pulmonary effort is normal.     Breath sounds: Normal breath sounds.  Musculoskeletal:     Right lower leg: No edema.     Left lower leg: No edema.  Neurological:     Mental Status: She is alert.       EKG:   Recent  Labs: 02/27/2023: Hemoglobin 14.3; Platelets 302    Lipid Panel No results found for: "CHOL", "TRIG", "HDL", "CHOLHDL", "VLDL", "LDLCALC", "LDLDIRECT"    Other studies Reviewed: Additional studies/ records that were reviewed today include:  Review of the above records demonstrates:       No data to display            ASSESSMENT AND PLAN:    ICD-10-CM   1. Palpitation  R00.2    Its less now palpitation after taking metoprolol at night.    2. Inappropriate sinus tachycardia (HCC)  I47.11    HR 75    3. Nonrheumatic mitral valve regurgitation  I34.0    Trace, MR, Tr, normal EF grade 1 diastolic dysfunction,.    4. Other chest pain  R07.89    no chest pain       Problem List Items Addressed This Visit   None Visit Diagnoses       Palpitation    -  Primary   Its less now palpitation after taking metoprolol at night.      Inappropriate sinus tachycardia (HCC)       HR 75     Nonrheumatic mitral valve regurgitation       Trace, MR, Tr, normal EF grade 1 diastolic dysfunction,.     Other chest pain       no chest pain          Disposition:   Return in about 3 months (around 10/03/2023).    Total time spent: 30 minutes  Signed,  Adrian Blackwater, MD  07/03/2023 11:01 AM    [facility]

## 2023-07-09 ENCOUNTER — Other Ambulatory Visit: Payer: Self-pay | Admitting: Cardiovascular Disease

## 2023-07-22 ENCOUNTER — Other Ambulatory Visit: Payer: Self-pay

## 2023-07-22 DIAGNOSIS — I5189 Other ill-defined heart diseases: Secondary | ICD-10-CM

## 2023-07-23 MED ORDER — SPIRONOLACTONE 25 MG PO TABS: 25.0000 mg | ORAL_TABLET | Freq: Every day | ORAL | 0 refills | Status: DC

## 2023-08-28 ENCOUNTER — Inpatient Hospital Stay: Payer: BC Managed Care – PPO | Admitting: Oncology

## 2023-08-28 ENCOUNTER — Telehealth: Payer: Self-pay | Admitting: *Deleted

## 2023-08-28 ENCOUNTER — Inpatient Hospital Stay: Payer: BC Managed Care – PPO | Attending: Oncology

## 2023-08-28 NOTE — Telephone Encounter (Signed)
 The husband called in to say that she is not feeling good and she is not going to be coming here at 10 AM and she she told the husband that she would call back and set up the next appointment when she feels better

## 2023-08-28 NOTE — Assessment & Plan Note (Deleted)
 Labs are reviewed and discussed with patient.  Erythrocytosis is an abnormal elevation of hemoglobin and/or hematocrit in peripheral blood, and this can be caused by primary etiology, ie myeloproliferative disease, or secondary etiology, ie sleep apnea etc  or familiar condition. She does not smoke Likely erythrocytosis is due to sleep apnea, encourage patient to use CPAP I will check erythropoietin, carbo monoxide level, JAK2 with reflex to other mutations, BCR-ABL1 FISH.

## 2023-10-02 ENCOUNTER — Ambulatory Visit (INDEPENDENT_AMBULATORY_CARE_PROVIDER_SITE_OTHER): Admitting: Cardiovascular Disease

## 2023-10-02 ENCOUNTER — Encounter: Payer: Self-pay | Admitting: Cardiovascular Disease

## 2023-10-02 VITALS — BP 124/76 | HR 74 | Ht 67.0 in | Wt 230.0 lb

## 2023-10-02 DIAGNOSIS — I340 Nonrheumatic mitral (valve) insufficiency: Secondary | ICD-10-CM

## 2023-10-02 DIAGNOSIS — R0789 Other chest pain: Secondary | ICD-10-CM

## 2023-10-02 DIAGNOSIS — I4711 Inappropriate sinus tachycardia, so stated: Secondary | ICD-10-CM

## 2023-10-02 DIAGNOSIS — R002 Palpitations: Secondary | ICD-10-CM

## 2023-10-02 DIAGNOSIS — Z0130 Encounter for examination of blood pressure without abnormal findings: Secondary | ICD-10-CM

## 2023-10-02 NOTE — Progress Notes (Signed)
 Cardiology Office Note   Date:  10/02/2023   ID:  Joanne Rowland, DOB Mar 11, 1976, MRN [id number]  PCP:  Cyrus Selinda Moose, PA-C  Cardiologist:  Denyse Bathe, MD      History of Present Illness: Joanne Rowland is a 48 y.o. female who presents for  Chief Complaint  Patient presents with   Follow-up    Occasional palpitation at night.      Past Medical History:  Diagnosis Date   Cancer (HCC)    Diabetes mellitus without complication (HCC)    Hashimoto's disease      Past Surgical History:  Procedure Laterality Date   ABDOMINAL HYSTERECTOMY     CHOLECYSTECTOMY       Current Outpatient Medications  Medication Sig Dispense Refill   levothyroxine (SYNTHROID) 125 MCG tablet Take 125 mcg by mouth daily before breakfast.     metoprolol succinate (TOPROL-XL) 100 MG 24 hr tablet TAKE 1 TABLET BY MOUTH EVERY DAY 90 tablet 1   nortriptyline (PAMELOR) 25 MG capsule Take 1 capsule by mouth at bedtime.     pantoprazole (PROTONIX) 40 MG tablet Take 40 mg by mouth daily.     spironolactone  (ALDACTONE ) 25 MG tablet Take 1 tablet (25 mg total) by mouth daily. 90 tablet 0   No current facility-administered medications for this visit.    Allergies:   Sumatriptan and Ozempic (0.25 or 0.5 mg-dose) [semaglutide(0.25 or 0.5mg -dos)]    Social History:   reports that she has never smoked. She has never used smokeless tobacco. She reports current alcohol use. No history on file for drug use.   Family History:  family history includes Diabetes in her father, maternal grandmother, and paternal grandfather; Hemochromatosis in her mother.    ROS:     Review of Systems  Constitutional: Negative.   HENT: Negative.    Eyes: Negative.   Respiratory: Negative.    Gastrointestinal: Negative.   Genitourinary: Negative.   Musculoskeletal: Negative.   Skin: Negative.   Neurological: Negative.   Endo/Heme/Allergies: Negative.   Psychiatric/Behavioral: Negative.    All other systems  reviewed and are negative.     All other systems are reviewed and negative.    PHYSICAL EXAM: VS:  BP 124/76   Pulse 74   Ht 5' 7 (1.702 m)   Wt 230 lb (104.3 kg)   SpO2 97%   BMI 36.02 kg/m  , BMI Body mass index is 36.02 kg/m. Last weight:  Wt Readings from Last 3 Encounters:  10/02/23 230 lb (104.3 kg)  07/03/23 224 lb (101.6 kg)  04/24/23 221 lb 9.6 oz (100.5 kg)     Physical Exam Constitutional:      Appearance: Normal appearance.   Cardiovascular:     Rate and Rhythm: Normal rate and regular rhythm.     Heart sounds: Normal heart sounds.  Pulmonary:     Effort: Pulmonary effort is normal.     Breath sounds: Normal breath sounds.   Musculoskeletal:     Right lower leg: No edema.     Left lower leg: No edema.   Neurological:     Mental Status: She is alert.       EKG:   Recent Labs: 02/27/2023: Hemoglobin 14.3; Platelets 302    Lipid Panel No results found for: CHOL, TRIG, HDL, CHOLHDL, VLDL, LDLCALC, LDLDIRECT    Other studies Reviewed: Additional studies/ records that were reviewed today include:  Review of the above records demonstrates:       No  data to display            ASSESSMENT AND PLAN:    ICD-10-CM   1. Palpitation  R00.2    Take extra dose metoprolol  or 1/2 tab PRN for palpitation.    2. Inappropriate sinus tachycardia (HCC)  I47.11     3. Nonrheumatic mitral valve regurgitation  I34.0     4. Other chest pain  R07.89        Problem List Items Addressed This Visit   None Visit Diagnoses       Palpitation    -  Primary   Take extra dose metoprolol  or 1/2 tab PRN for palpitation.     Inappropriate sinus tachycardia (HCC)         Nonrheumatic mitral valve regurgitation         Other chest pain              Disposition:   Return in about 3 months (around 01/02/2024).    Total time spent: 35 minutes  Signed,  Denyse Bathe, MD  10/02/2023 2:35 PM    [facility]

## 2023-10-21 ENCOUNTER — Other Ambulatory Visit: Payer: Self-pay | Admitting: Cardiovascular Disease

## 2023-10-21 DIAGNOSIS — I5189 Other ill-defined heart diseases: Secondary | ICD-10-CM

## 2024-01-08 ENCOUNTER — Ambulatory Visit: Admitting: Cardiovascular Disease

## 2024-01-13 ENCOUNTER — Other Ambulatory Visit: Payer: Self-pay | Admitting: Cardiovascular Disease

## 2024-01-16 ENCOUNTER — Other Ambulatory Visit: Payer: Self-pay | Admitting: Cardiovascular Disease

## 2024-01-16 DIAGNOSIS — I5189 Other ill-defined heart diseases: Secondary | ICD-10-CM

## 2024-01-19 ENCOUNTER — Ambulatory Visit (INDEPENDENT_AMBULATORY_CARE_PROVIDER_SITE_OTHER): Admitting: Cardiovascular Disease

## 2024-01-19 ENCOUNTER — Encounter: Payer: Self-pay | Admitting: Cardiovascular Disease

## 2024-01-19 VITALS — BP 104/68 | HR 67 | Ht 67.0 in | Wt 228.2 lb

## 2024-01-19 DIAGNOSIS — R002 Palpitations: Secondary | ICD-10-CM

## 2024-01-19 DIAGNOSIS — I5189 Other ill-defined heart diseases: Secondary | ICD-10-CM | POA: Diagnosis not present

## 2024-01-19 DIAGNOSIS — I4711 Inappropriate sinus tachycardia, so stated: Secondary | ICD-10-CM | POA: Diagnosis not present

## 2024-01-19 DIAGNOSIS — I340 Nonrheumatic mitral (valve) insufficiency: Secondary | ICD-10-CM | POA: Diagnosis not present

## 2024-01-19 NOTE — Progress Notes (Signed)
 Cardiology Office Note   Date:  01/19/2024   ID:  Joanne Rowland, DOB 10-28-1975, MRN [id number]  PCP:  Cyrus Selinda Moose, PA-C  Cardiologist:  Denyse Bathe, MD      History of Present Illness: Joanne Rowland is a 48 y.o. female who presents for  Chief Complaint  Patient presents with   Follow-up    3 month follow up    No complaints.      Past Medical History:  Diagnosis Date   Cancer (HCC)    Diabetes mellitus without complication (HCC)    Hashimoto's disease      Past Surgical History:  Procedure Laterality Date   ABDOMINAL HYSTERECTOMY     CHOLECYSTECTOMY       Current Outpatient Medications  Medication Sig Dispense Refill   Cyanocobalamin (B-12 COMPLIANCE INJECTION IJ) Inject 5 mLs as directed once. (Patient taking differently: Inject 5 mLs as directed once. Once a month)     levothyroxine (SYNTHROID) 125 MCG tablet Take 125 mcg by mouth daily before breakfast.     metoprolol succinate (TOPROL-XL) 100 MG 24 hr tablet TAKE 1 TABLET BY MOUTH EVERY DAY 90 tablet 1   nortriptyline (PAMELOR) 25 MG capsule Take 1 capsule by mouth at bedtime.     pantoprazole (PROTONIX) 40 MG tablet Take 40 mg by mouth daily.     spironolactone  (ALDACTONE ) 25 MG tablet TAKE 1 TABLET (25 MG TOTAL) BY MOUTH DAILY. 90 tablet 0   No current facility-administered medications for this visit.    Allergies:   Sumatriptan and Ozempic (0.25 or 0.5 mg-dose) [semaglutide(0.25 or 0.5mg -dos)]    Social History:   reports that she has never smoked. She has never used smokeless tobacco. She reports current alcohol use. No history on file for drug use.   Family History:  family history includes Diabetes in her father, maternal grandmother, and paternal grandfather; Hemochromatosis in her mother.    ROS:     Review of Systems  Constitutional: Negative.   HENT: Negative.    Eyes: Negative.   Respiratory: Negative.    Gastrointestinal: Negative.   Genitourinary: Negative.    Musculoskeletal: Negative.   Skin: Negative.   Neurological: Negative.   Endo/Heme/Allergies: Negative.   Psychiatric/Behavioral: Negative.    All other systems reviewed and are negative.     All other systems are reviewed and negative.    PHYSICAL EXAM: VS:  BP 104/68   Pulse 67   Ht 5' 7 (1.702 m)   Wt 228 lb 3.2 oz (103.5 kg)   SpO2 96%   BMI 35.74 kg/m  , BMI Body mass index is 35.74 kg/m. Last weight:  Wt Readings from Last 3 Encounters:  01/19/24 228 lb 3.2 oz (103.5 kg)  10/02/23 230 lb (104.3 kg)  07/03/23 224 lb (101.6 kg)     Physical Exam Constitutional:      Appearance: Normal appearance.  Cardiovascular:     Rate and Rhythm: Normal rate and regular rhythm.     Heart sounds: Normal heart sounds.  Pulmonary:     Effort: Pulmonary effort is normal.     Breath sounds: Normal breath sounds.  Musculoskeletal:     Right lower leg: No edema.     Left lower leg: No edema.  Neurological:     Mental Status: She is alert.       EKG:   Recent Labs: 02/27/2023: Hemoglobin 14.3; Platelets 302    Lipid Panel No results found for: CHOL, TRIG, HDL, CHOLHDL,  VLDL, LDLCALC, LDLDIRECT    Other studies Reviewed: Additional studies/ records that were reviewed today include:  Review of the above records demonstrates:       No data to display            ASSESSMENT AND PLAN:    ICD-10-CM   1. Inappropriate sinus tachycardia  I47.11     2. Palpitation  R00.2    Only 2 episode of palpitation, and took extra dose metoprolol, helped along with taking in evening.    3. Diastolic dysfunction  I51.89     4. Nonrheumatic mitral valve regurgitation  I34.0        Problem List Items Addressed This Visit   None Visit Diagnoses       Inappropriate sinus tachycardia    -  Primary     Palpitation       Only 2 episode of palpitation, and took extra dose metoprolol, helped along with taking in evening.     Diastolic dysfunction          Nonrheumatic mitral valve regurgitation              Disposition:   Return in about 3 months (around 04/20/2024).    Total time spent: 30 minutes  Signed,  Denyse Bathe, MD  01/19/2024 2:06 PM    [facility]

## 2024-04-14 ENCOUNTER — Other Ambulatory Visit: Payer: Self-pay | Admitting: Cardiovascular Disease

## 2024-04-14 DIAGNOSIS — I5189 Other ill-defined heart diseases: Secondary | ICD-10-CM

## 2024-04-22 ENCOUNTER — Ambulatory Visit: Admitting: Cardiovascular Disease

## 2024-05-13 ENCOUNTER — Ambulatory Visit: Admitting: Cardiovascular Disease

## 2024-05-20 ENCOUNTER — Ambulatory Visit: Admitting: Cardiovascular Disease
